# Patient Record
Sex: Male | Born: 1980 | Race: Black or African American | Hispanic: No | Marital: Single | Smoking: Current every day smoker
Health system: Southern US, Community
[De-identification: ages and names within clinical notes are randomized; demographics above are authoritative.]

## PROBLEM LIST (undated history)

## (undated) DIAGNOSIS — F419 Anxiety disorder, unspecified: Secondary | ICD-10-CM

## (undated) DIAGNOSIS — F209 Schizophrenia, unspecified: Secondary | ICD-10-CM

## (undated) DIAGNOSIS — J45909 Unspecified asthma, uncomplicated: Secondary | ICD-10-CM

---

## 2013-02-03 ENCOUNTER — Emergency Department: Payer: Self-pay | Admitting: Emergency Medicine

## 2013-02-03 LAB — COMPREHENSIVE METABOLIC PANEL
Albumin: 4.4 g/dL (ref 3.4–5.0)
Alkaline Phosphatase: 81 U/L (ref 50–136)
Bilirubin,Total: 0.7 mg/dL (ref 0.2–1.0)
Calcium, Total: 10 mg/dL (ref 8.5–10.1)
EGFR (African American): >60
EGFR (Non-African Amer.): >60
Glucose: 97 mg/dL (ref 65–99)
Osmolality: 276 (ref 275–301)
Potassium: 3.5 mmol/L (ref 3.5–5.1)
SGOT(AST): 27 U/L (ref 15–37)
SGPT (ALT): 31 U/L (ref 12–78)
Sodium: 139 mmol/L (ref 136–145)
Total Protein: 8.8 g/dL — ABNORMAL HIGH (ref 6.4–8.2)

## 2013-02-03 LAB — DRUG SCREEN, URINE
Amphetamines, Ur Screen: NEGATIVE (ref ?–1000)
Barbiturates, Ur Screen: NEGATIVE (ref ?–200)
MDMA (Ecstasy)Ur Screen: NEGATIVE (ref ?–500)
Methadone, Ur Screen: NEGATIVE (ref ?–300)
Opiate, Ur Screen: NEGATIVE (ref ?–300)
Tricyclic, Ur Screen: NEGATIVE (ref ?–1000)

## 2013-02-03 LAB — URINALYSIS, COMPLETE
Bacteria: NONE SEEN
Bilirubin,UR: NEGATIVE
Glucose,UR: NEGATIVE mg/dL (ref 0–75)
Ketone: NEGATIVE
Nitrite: NEGATIVE
Ph: 7 (ref 4.5–8.0)
Protein: NEGATIVE
RBC,UR: 2 /HPF (ref 0–5)
WBC UR: 2 /HPF (ref 0–5)

## 2013-02-03 LAB — CBC
MCH: 31.2 pg (ref 26.0–34.0)
RBC: 5.58 10*6/uL (ref 4.40–5.90)
RDW: 14 % (ref 11.5–14.5)

## 2013-02-04 LAB — CK TOTAL AND CKMB (NOT AT ARMC)
CK, Total: 1291 U/L — ABNORMAL HIGH (ref 35–232)
CK-MB: 1.8 ng/mL (ref 0.5–3.6)

## 2013-02-05 LAB — CK: CK, Total: 873 U/L — ABNORMAL HIGH (ref 35–232)

## 2013-02-06 LAB — CBC
HCT: 45.3 % (ref 40.0–52.0)
HGB: 15.8 g/dL (ref 13.0–18.0)
MCH: 31.5 pg (ref 26.0–34.0)
MCHC: 35 g/dL (ref 32.0–36.0)
Platelet: 183 10*3/uL (ref 150–440)
RBC: 5.02 10*6/uL (ref 4.40–5.90)
RDW: 13.9 % (ref 11.5–14.5)

## 2013-02-06 LAB — COMPREHENSIVE METABOLIC PANEL
Albumin: 3.6 g/dL (ref 3.4–5.0)
Anion Gap: 3 — ABNORMAL LOW (ref 7–16)
BUN: 10 mg/dL (ref 7–18)
Bilirubin,Total: 0.3 mg/dL (ref 0.2–1.0)
Chloride: 104 mmol/L (ref 98–107)
Co2: 30 mmol/L (ref 21–32)
EGFR (African American): >60
EGFR (Non-African Amer.): >60
Glucose: 91 mg/dL (ref 65–99)
Osmolality: 272 (ref 275–301)
Potassium: 3.9 mmol/L (ref 3.5–5.1)
SGOT(AST): 38 U/L — ABNORMAL HIGH (ref 15–37)
SGPT (ALT): 28 U/L (ref 12–78)
Sodium: 137 mmol/L (ref 136–145)
Total Protein: 7.4 g/dL (ref 6.4–8.2)

## 2013-06-23 ENCOUNTER — Inpatient Hospital Stay: Payer: Self-pay | Admitting: Psychiatry

## 2013-06-23 LAB — URINALYSIS, COMPLETE
Glucose,UR: 150 mg/dL (ref 0–75)
Ketone: NEGATIVE
Leukocyte Esterase: NEGATIVE
Ph: 6 (ref 4.5–8.0)
RBC,UR: 3 /HPF (ref 0–5)
Specific Gravity: 1.018 (ref 1.003–1.030)
Squamous Epithelial: NONE SEEN

## 2013-06-23 LAB — COMPREHENSIVE METABOLIC PANEL
Alkaline Phosphatase: 61 U/L (ref 50–136)
Anion Gap: 7 (ref 7–16)
BUN: 8 mg/dL (ref 7–18)
Bilirubin,Total: 0.4 mg/dL (ref 0.2–1.0)
Calcium, Total: 9.3 mg/dL (ref 8.5–10.1)
Chloride: 106 mmol/L (ref 98–107)
Co2: 26 mmol/L (ref 21–32)
EGFR (African American): >60
Glucose: 124 mg/dL — ABNORMAL HIGH (ref 65–99)
Potassium: 3.5 mmol/L (ref 3.5–5.1)
SGOT(AST): 24 U/L (ref 15–37)
SGPT (ALT): 22 U/L (ref 12–78)
Sodium: 139 mmol/L (ref 136–145)
Total Protein: 8 g/dL (ref 6.4–8.2)

## 2013-06-23 LAB — CBC
HGB: 16.2 g/dL (ref 13.0–18.0)
MCH: 31.3 pg (ref 26.0–34.0)
MCHC: 34.9 g/dL (ref 32.0–36.0)
Platelet: 243 10*3/uL (ref 150–440)
RBC: 5.17 10*6/uL (ref 4.40–5.90)
WBC: 7.1 10*3/uL (ref 3.8–10.6)

## 2013-06-23 LAB — DRUG SCREEN, URINE
Amphetamines, Ur Screen: NEGATIVE (ref ?–1000)
Barbiturates, Ur Screen: NEGATIVE (ref ?–200)
Cocaine Metabolite,Ur ~~LOC~~: NEGATIVE (ref ?–300)
MDMA (Ecstasy)Ur Screen: NEGATIVE (ref ?–500)
Tricyclic, Ur Screen: NEGATIVE (ref ?–1000)

## 2013-06-23 LAB — TSH: Thyroid Stimulating Horm: 1.51 u[IU]/mL

## 2013-06-23 LAB — ETHANOL
Ethanol %: <0.003 % (ref 0.000–0.080)
Ethanol: <3 mg/dL

## 2013-06-23 LAB — SALICYLATE LEVEL: Salicylates, Serum: 2.8 mg/dL

## 2013-06-23 LAB — ACETAMINOPHEN LEVEL: Acetaminophen: <2 ug/mL

## 2013-06-24 LAB — URINALYSIS, COMPLETE
Blood: NEGATIVE
Leukocyte Esterase: NEGATIVE
RBC,UR: 2 /HPF (ref 0–5)
Specific Gravity: 1.018 (ref 1.003–1.030)
Squamous Epithelial: NONE SEEN
WBC UR: 7 /HPF (ref 0–5)

## 2013-07-15 LAB — COMPREHENSIVE METABOLIC PANEL
Albumin: 4.3 g/dL (ref 3.4–5.0)
Anion Gap: 7 (ref 7–16)
BUN: 10 mg/dL (ref 7–18)
Calcium, Total: 9.5 mg/dL (ref 8.5–10.1)
Chloride: 103 mmol/L (ref 98–107)
Co2: 25 mmol/L (ref 21–32)
EGFR (Non-African Amer.): >60
Osmolality: 270 (ref 275–301)
Potassium: 3.9 mmol/L (ref 3.5–5.1)
SGPT (ALT): 82 U/L — ABNORMAL HIGH (ref 12–78)
Sodium: 135 mmol/L — ABNORMAL LOW (ref 136–145)
Total Protein: 8.9 g/dL — ABNORMAL HIGH (ref 6.4–8.2)

## 2013-07-15 LAB — ETHANOL: Ethanol %: <0.003 % (ref 0.000–0.080)

## 2013-07-15 LAB — CBC
HGB: 16.9 g/dL (ref 13.0–18.0)
MCH: 31.5 pg (ref 26.0–34.0)
MCV: 89 fL (ref 80–100)
Platelet: 210 10*3/uL (ref 150–440)
RBC: 5.35 10*6/uL (ref 4.40–5.90)
WBC: 8.7 10*3/uL (ref 3.8–10.6)

## 2013-07-16 ENCOUNTER — Inpatient Hospital Stay: Payer: Self-pay | Admitting: Psychiatry

## 2013-07-16 LAB — URINALYSIS, COMPLETE
Glucose,UR: NEGATIVE mg/dL (ref 0–75)
Nitrite: NEGATIVE
Ph: 5 (ref 4.5–8.0)
RBC,UR: <1 /HPF (ref 0–5)
Specific Gravity: 1.015 (ref 1.003–1.030)
Squamous Epithelial: NONE SEEN
WBC UR: 1 /HPF (ref 0–5)

## 2013-07-16 LAB — DRUG SCREEN, URINE
Amphetamines, Ur Screen: NEGATIVE (ref ?–1000)
Cannabinoid 50 Ng, Ur ~~LOC~~: NEGATIVE (ref ?–50)
Cocaine Metabolite,Ur ~~LOC~~: NEGATIVE (ref ?–300)
Methadone, Ur Screen: NEGATIVE (ref ?–300)
Opiate, Ur Screen: NEGATIVE (ref ?–300)
Tricyclic, Ur Screen: NEGATIVE (ref ?–1000)

## 2013-07-31 ENCOUNTER — Emergency Department: Payer: Self-pay | Admitting: Emergency Medicine

## 2013-07-31 LAB — CBC WITH DIFFERENTIAL/PLATELET
Basophil %: 0.3 %
Eosinophil #: 0.1 10*3/uL (ref 0.0–0.7)
Eosinophil %: 0.9 %
HCT: 37.9 % — ABNORMAL LOW (ref 40.0–52.0)
HGB: 13.1 g/dL (ref 13.0–18.0)
Lymphocyte #: 1.1 10*3/uL (ref 1.0–3.6)
MCH: 30.9 pg (ref 26.0–34.0)
MCHC: 34.6 g/dL (ref 32.0–36.0)
MCV: 89 fL (ref 80–100)
Monocyte %: 13.4 %
Neutrophil #: 9.5 10*3/uL — ABNORMAL HIGH (ref 1.4–6.5)
Neutrophil %: 76.4 %
RDW: 13.1 % (ref 11.5–14.5)
WBC: 12.5 10*3/uL — ABNORMAL HIGH (ref 3.8–10.6)

## 2013-07-31 LAB — COMPREHENSIVE METABOLIC PANEL
Alkaline Phosphatase: 75 U/L (ref 50–136)
Anion Gap: 3 — ABNORMAL LOW (ref 7–16)
BUN: 11 mg/dL (ref 7–18)
Co2: 31 mmol/L (ref 21–32)
Creatinine: 1.06 mg/dL (ref 0.60–1.30)
EGFR (African American): >60
EGFR (Non-African Amer.): >60
Potassium: 4 mmol/L (ref 3.5–5.1)
SGOT(AST): 53 U/L — ABNORMAL HIGH (ref 15–37)
SGPT (ALT): 177 U/L — ABNORMAL HIGH (ref 12–78)
Sodium: 134 mmol/L — ABNORMAL LOW (ref 136–145)
Total Protein: 7.2 g/dL (ref 6.4–8.2)

## 2013-09-16 LAB — URINALYSIS, COMPLETE
Bacteria: NONE SEEN
Blood: NEGATIVE
Glucose,UR: NEGATIVE mg/dL (ref 0–75)
Leukocyte Esterase: NEGATIVE
Ph: 6 (ref 4.5–8.0)
Protein: 100
Squamous Epithelial: NONE SEEN
WBC UR: 3 /HPF (ref 0–5)

## 2013-09-16 LAB — DRUG SCREEN, URINE
Amphetamines, Ur Screen: NEGATIVE (ref ?–1000)
Benzodiazepine, Ur Scrn: NEGATIVE (ref ?–200)
Cocaine Metabolite,Ur ~~LOC~~: NEGATIVE (ref ?–300)
MDMA (Ecstasy)Ur Screen: NEGATIVE (ref ?–500)
Opiate, Ur Screen: NEGATIVE (ref ?–300)
Tricyclic, Ur Screen: NEGATIVE (ref ?–1000)

## 2013-09-17 LAB — COMPREHENSIVE METABOLIC PANEL
Albumin: 4.6 g/dL (ref 3.4–5.0)
Alkaline Phosphatase: 72 U/L
Anion Gap: 7 (ref 7–16)
BUN: 9 mg/dL (ref 7–18)
Bilirubin,Total: 0.5 mg/dL (ref 0.2–1.0)
Calcium, Total: 9.8 mg/dL (ref 8.5–10.1)
Co2: 29 mmol/L (ref 21–32)
Creatinine: 1.17 mg/dL (ref 0.60–1.30)
Glucose: 102 mg/dL — ABNORMAL HIGH (ref 65–99)
Potassium: 4.9 mmol/L (ref 3.5–5.1)
SGOT(AST): 37 U/L (ref 15–37)
SGPT (ALT): 48 U/L (ref 12–78)
Total Protein: 8.8 g/dL — ABNORMAL HIGH (ref 6.4–8.2)

## 2013-09-17 LAB — TSH: Thyroid Stimulating Horm: 1.92 u[IU]/mL

## 2013-09-17 LAB — ETHANOL
Ethanol %: <0.003 % (ref 0.000–0.080)
Ethanol: <3 mg/dL

## 2013-09-19 ENCOUNTER — Inpatient Hospital Stay: Payer: Self-pay | Admitting: Psychiatry

## 2014-01-17 ENCOUNTER — Inpatient Hospital Stay: Payer: Self-pay | Admitting: Psychiatry

## 2014-01-17 LAB — DRUG SCREEN, URINE

## 2014-01-17 LAB — COMPREHENSIVE METABOLIC PANEL
ALBUMIN: 4.4 g/dL (ref 3.4–5.0)
Alkaline Phosphatase: 84 U/L
Anion Gap: 6 — ABNORMAL LOW (ref 7–16)
BUN: 7 mg/dL (ref 7–18)
Bilirubin,Total: 0.4 mg/dL (ref 0.2–1.0)
CALCIUM: 9.3 mg/dL (ref 8.5–10.1)
Chloride: 104 mmol/L (ref 98–107)
Co2: 26 mmol/L (ref 21–32)
Creatinine: 0.97 mg/dL (ref 0.60–1.30)
EGFR (African American): >60
EGFR (Non-African Amer.): >60
Glucose: 112 mg/dL — ABNORMAL HIGH (ref 65–99)
Osmolality: 271 (ref 275–301)
POTASSIUM: 3.8 mmol/L (ref 3.5–5.1)
SGOT(AST): 26 U/L (ref 15–37)
SGPT (ALT): 29 U/L (ref 12–78)
Sodium: 136 mmol/L (ref 136–145)
Total Protein: 8.9 g/dL — ABNORMAL HIGH (ref 6.4–8.2)

## 2014-01-17 LAB — URINALYSIS, COMPLETE
Bacteria: NONE SEEN
Bilirubin,UR: NEGATIVE
Glucose,UR: NEGATIVE mg/dL (ref 0–75)
Ketone: NEGATIVE
Leukocyte Esterase: NEGATIVE
Nitrite: NEGATIVE
Ph: 6 (ref 4.5–8.0)
Protein: NEGATIVE
RBC,UR: 2 /HPF (ref 0–5)
Specific Gravity: 1.017 (ref 1.003–1.030)
Squamous Epithelial: <1
WBC UR: 1 /HPF (ref 0–5)

## 2014-01-17 LAB — CBC
HCT: 49 % (ref 40.0–52.0)
HGB: 17.1 g/dL (ref 13.0–18.0)
MCH: 31.1 pg (ref 26.0–34.0)
MCHC: 34.8 g/dL (ref 32.0–36.0)
MCV: 89 fL (ref 80–100)
PLATELETS: 242 10*3/uL (ref 150–440)
RBC: 5.49 10*6/uL (ref 4.40–5.90)
RDW: 14.6 % — ABNORMAL HIGH (ref 11.5–14.5)
WBC: 7.9 10*3/uL (ref 3.8–10.6)

## 2014-01-17 LAB — SALICYLATE LEVEL: SALICYLATES, SERUM: 5 mg/dL — AB

## 2014-01-17 LAB — ACETAMINOPHEN LEVEL

## 2014-01-17 LAB — LITHIUM LEVEL

## 2014-01-17 LAB — ETHANOL: Ethanol %: <0.003 % (ref 0.000–0.080)

## 2014-01-17 LAB — TSH: THYROID STIMULATING HORM: 2.1 u[IU]/mL

## 2014-01-22 LAB — LITHIUM LEVEL: Lithium: 0.52 mmol/L — ABNORMAL LOW

## 2014-06-26 LAB — COMPREHENSIVE METABOLIC PANEL
ALBUMIN: 4.4 g/dL (ref 3.4–5.0)
ALT: 62 U/L
ANION GAP: 5 — AB (ref 7–16)
Alkaline Phosphatase: 78 U/L
BUN: 12 mg/dL (ref 7–18)
Bilirubin,Total: 0.5 mg/dL (ref 0.2–1.0)
CALCIUM: 9.5 mg/dL (ref 8.5–10.1)
Chloride: 106 mmol/L (ref 98–107)
Co2: 22 mmol/L (ref 21–32)
Creatinine: 1.35 mg/dL — ABNORMAL HIGH (ref 0.60–1.30)
Glucose: 101 mg/dL — ABNORMAL HIGH (ref 65–99)
Osmolality: 266 (ref 275–301)
POTASSIUM: 4.1 mmol/L (ref 3.5–5.1)
SGOT(AST): 28 U/L (ref 15–37)
Sodium: 133 mmol/L — ABNORMAL LOW (ref 136–145)
TOTAL PROTEIN: 9.1 g/dL — AB (ref 6.4–8.2)

## 2014-06-26 LAB — URINALYSIS, COMPLETE
BACTERIA: NONE SEEN
BILIRUBIN, UR: NEGATIVE
Cellular Cast: 2
Glucose,UR: NEGATIVE mg/dL (ref 0–75)
Hyaline Cast: 3
Leukocyte Esterase: NEGATIVE
Nitrite: NEGATIVE
Ph: 5 (ref 4.5–8.0)
Specific Gravity: 1.015 (ref 1.003–1.030)
Squamous Epithelial: <1
WBC UR: 2 /HPF (ref 0–5)

## 2014-06-26 LAB — DRUG SCREEN, URINE
AMPHETAMINES, UR SCREEN: NEGATIVE (ref ?–1000)
BARBITURATES, UR SCREEN: NEGATIVE (ref ?–200)
Benzodiazepine, Ur Scrn: NEGATIVE (ref ?–200)
Cannabinoid 50 Ng, Ur ~~LOC~~: NEGATIVE (ref ?–50)
Cocaine Metabolite,Ur ~~LOC~~: NEGATIVE (ref ?–300)
MDMA (ECSTASY) UR SCREEN: NEGATIVE (ref ?–500)
Methadone, Ur Screen: NEGATIVE (ref ?–300)
OPIATE, UR SCREEN: NEGATIVE (ref ?–300)
PHENCYCLIDINE (PCP) UR S: NEGATIVE (ref ?–25)
TRICYCLIC, UR SCREEN: NEGATIVE (ref ?–1000)

## 2014-06-26 LAB — CBC
HCT: 51.1 % (ref 40.0–52.0)
HGB: 16.8 g/dL (ref 13.0–18.0)
MCH: 29.8 pg (ref 26.0–34.0)
MCHC: 32.9 g/dL (ref 32.0–36.0)
MCV: 91 fL (ref 80–100)
PLATELETS: 253 10*3/uL (ref 150–440)
RBC: 5.63 10*6/uL (ref 4.40–5.90)
RDW: 13.4 % (ref 11.5–14.5)
WBC: 8.8 10*3/uL (ref 3.8–10.6)

## 2014-06-26 LAB — ACETAMINOPHEN LEVEL: Acetaminophen: <2 ug/mL

## 2014-06-26 LAB — ETHANOL: Ethanol: <3 mg/dL

## 2014-06-26 LAB — LITHIUM LEVEL: Lithium: 0.26 mmol/L — ABNORMAL LOW

## 2014-06-26 LAB — SALICYLATE LEVEL: Salicylates, Serum: 4.9 mg/dL — ABNORMAL HIGH

## 2014-06-27 ENCOUNTER — Inpatient Hospital Stay: Payer: Self-pay | Admitting: Psychiatry

## 2014-07-01 LAB — TSH: Thyroid Stimulating Horm: 1.92 u[IU]/mL

## 2014-07-01 LAB — HEMOGLOBIN A1C: Hemoglobin A1C: 5.5 % (ref 4.2–6.3)

## 2014-07-01 LAB — LITHIUM LEVEL: Lithium: 0.23 mmol/L — ABNORMAL LOW

## 2014-07-05 LAB — LITHIUM LEVEL: LITHIUM: 0.56 mmol/L — AB

## 2014-07-05 IMAGING — CR DG CHEST 2V
1 series · 2 of 2 positions shown · non-contrast
Comparison: 02/06/2013.

CLINICAL DATA: Right chest pain.

EXAM:
CHEST  2 VIEW

[Series 1: pa · 0.17mm/px · 2 of 2 slices shown]
[im 1/2]
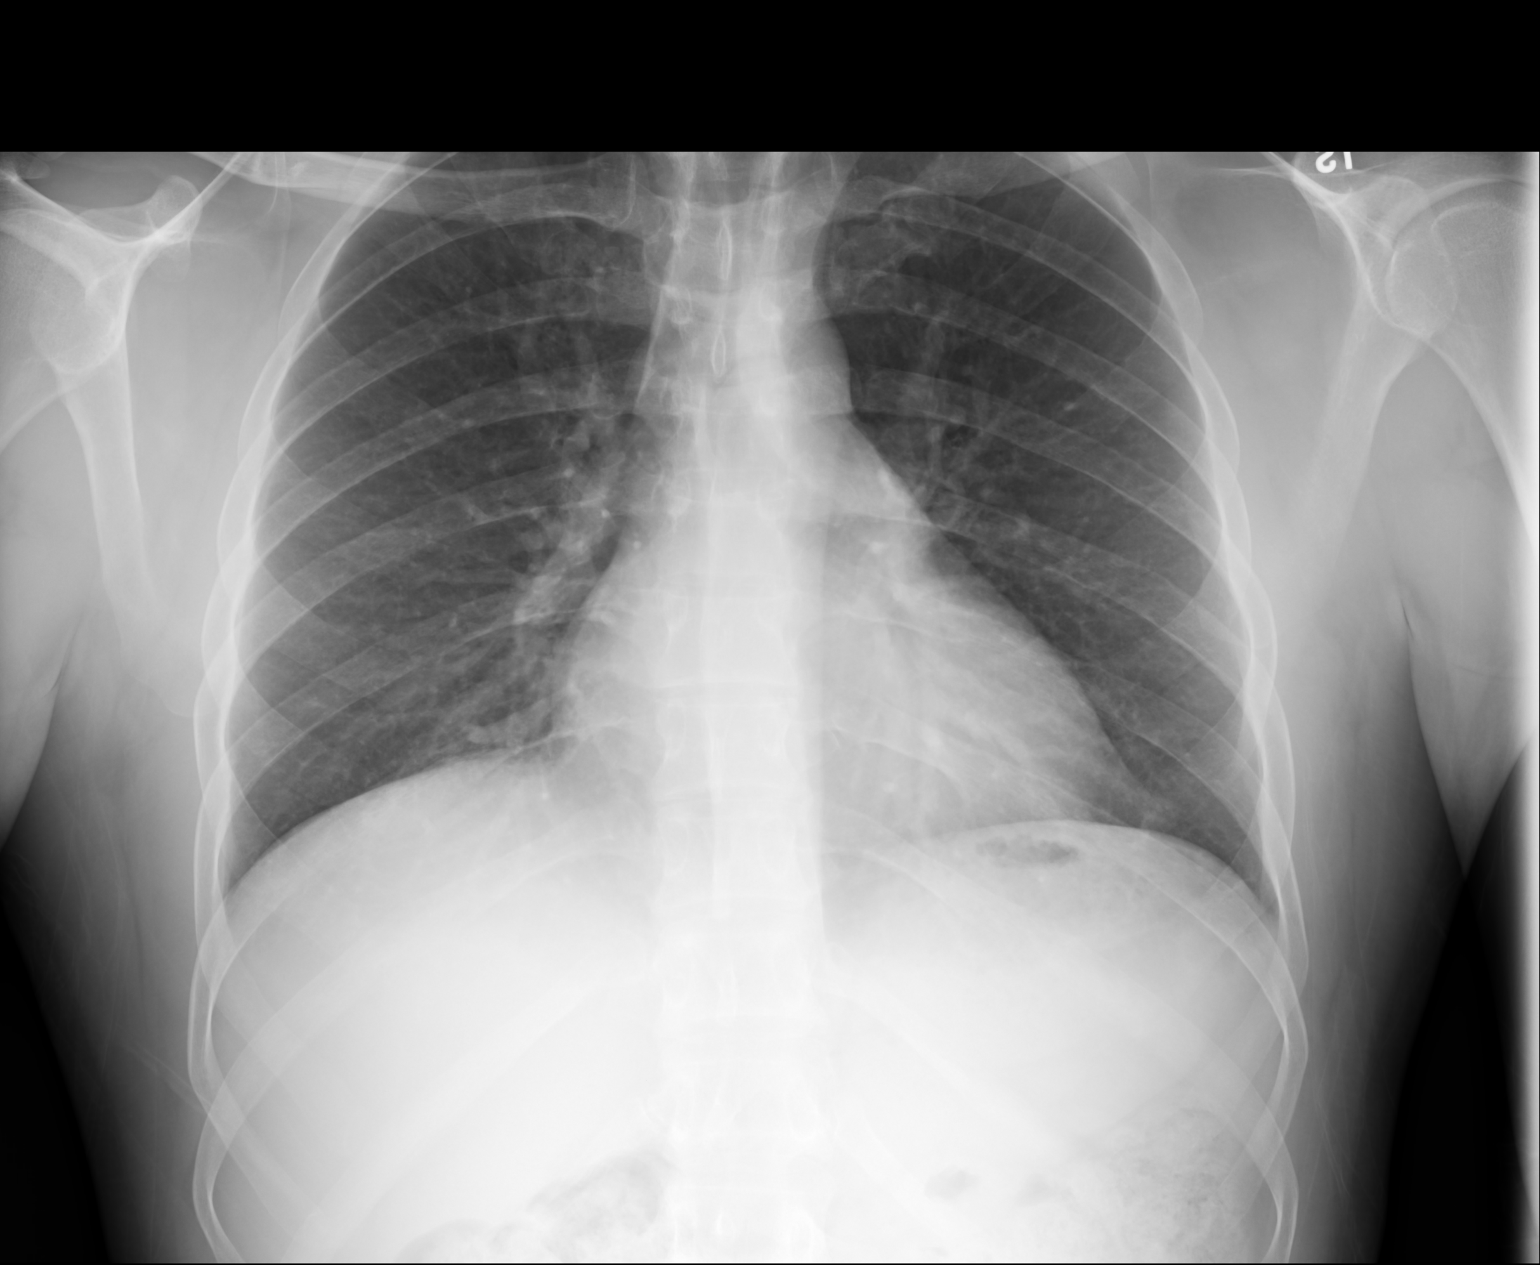
[im 2/2]
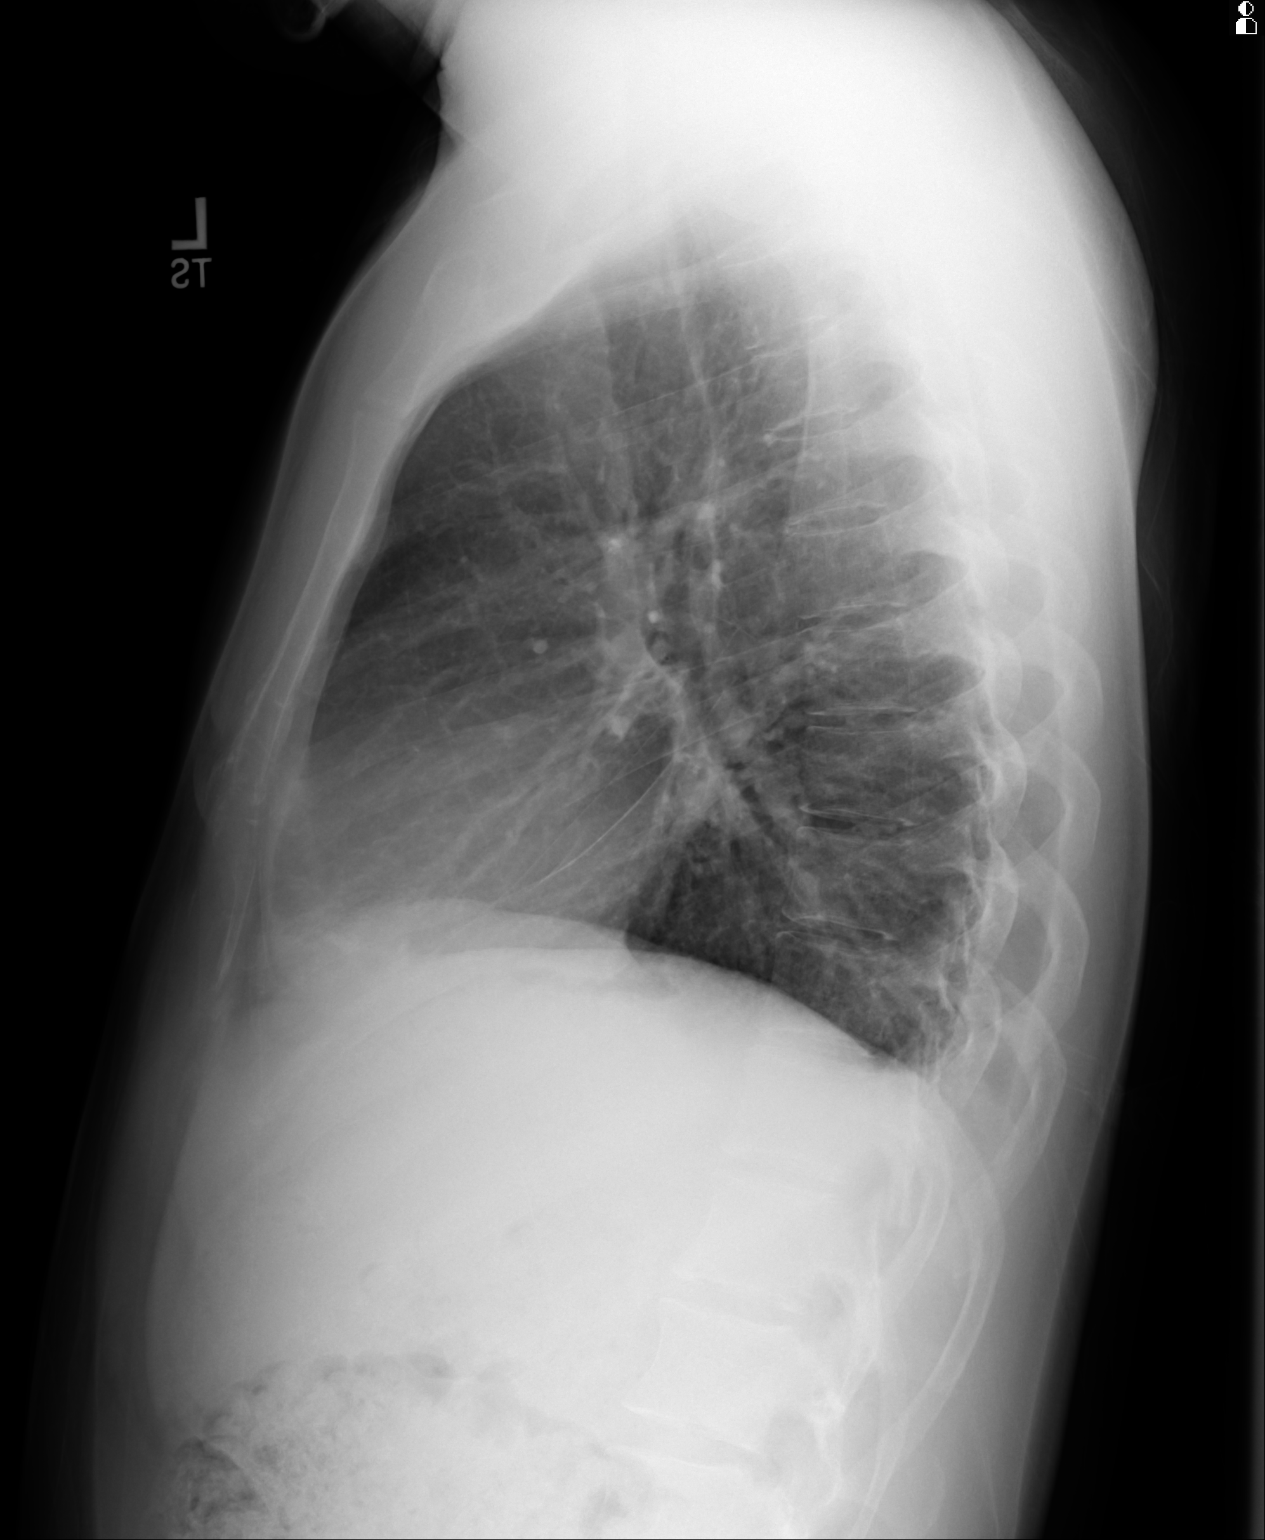

[2 of 2 positions shown; findings below may reference images not displayed]

FINDINGS: Poor inspiration. Grossly stable normal sized heart. Clear lungs.
Mild diffuse peribronchial thickening. Unremarkable bones.
IMPRESSION: Mild bronchitic changes.

## 2014-10-10 ENCOUNTER — Emergency Department: Payer: Self-pay | Admitting: Emergency Medicine

## 2014-10-10 LAB — COMPREHENSIVE METABOLIC PANEL
ALBUMIN: 4.1 g/dL (ref 3.4–5.0)
ALK PHOS: 76 U/L
Anion Gap: 4 — ABNORMAL LOW (ref 7–16)
BILIRUBIN TOTAL: 0.5 mg/dL (ref 0.2–1.0)
BUN: 12 mg/dL (ref 7–18)
CALCIUM: 9.4 mg/dL (ref 8.5–10.1)
CREATININE: 1.21 mg/dL (ref 0.60–1.30)
Chloride: 109 mmol/L — ABNORMAL HIGH (ref 98–107)
Co2: 30 mmol/L (ref 21–32)
EGFR (Non-African Amer.): >60
GLUCOSE: 99 mg/dL (ref 65–99)
Osmolality: 285 (ref 275–301)
Potassium: 3.9 mmol/L (ref 3.5–5.1)
SGOT(AST): 38 U/L — ABNORMAL HIGH (ref 15–37)
SGPT (ALT): 104 U/L — ABNORMAL HIGH
SODIUM: 143 mmol/L (ref 136–145)
Total Protein: 8.7 g/dL — ABNORMAL HIGH (ref 6.4–8.2)

## 2014-10-10 LAB — CBC WITH DIFFERENTIAL/PLATELET
BASOS ABS: 0.1 10*3/uL (ref 0.0–0.1)
BASOS PCT: 1.1 %
EOS PCT: 2 %
Eosinophil #: 0.1 10*3/uL (ref 0.0–0.7)
HCT: 49 % (ref 40.0–52.0)
HGB: 16.7 g/dL (ref 13.0–18.0)
LYMPHS ABS: 1.4 10*3/uL (ref 1.0–3.6)
Lymphocyte %: 21 %
MCH: 31 pg (ref 26.0–34.0)
MCHC: 34.2 g/dL (ref 32.0–36.0)
MCV: 91 fL (ref 80–100)
MONOS PCT: 13.2 %
Monocyte #: 0.9 x10 3/mm (ref 0.2–1.0)
NEUTROS ABS: 4.3 10*3/uL (ref 1.4–6.5)
Neutrophil %: 62.7 %
Platelet: 239 10*3/uL (ref 150–440)
RBC: 5.39 10*6/uL (ref 4.40–5.90)
RDW: 13.7 % (ref 11.5–14.5)
WBC: 6.8 10*3/uL (ref 3.8–10.6)

## 2014-10-10 LAB — DRUG SCREEN, URINE

## 2014-10-10 LAB — URINALYSIS, COMPLETE
BACTERIA: NONE SEEN
BILIRUBIN, UR: NEGATIVE
Glucose,UR: NEGATIVE mg/dL (ref 0–75)
Leukocyte Esterase: NEGATIVE
Nitrite: NEGATIVE
Ph: 5 (ref 4.5–8.0)
RBC,UR: 4 /HPF (ref 0–5)
SPECIFIC GRAVITY: 1.024 (ref 1.003–1.030)
WBC UR: 2 /HPF (ref 0–5)

## 2014-10-10 LAB — SALICYLATE LEVEL: SALICYLATES, SERUM: 2.9 mg/dL — AB

## 2014-10-10 LAB — ACETAMINOPHEN LEVEL

## 2014-10-10 LAB — LITHIUM LEVEL: Lithium: <0.2 mmol/L — ABNORMAL LOW

## 2014-10-10 LAB — ETHANOL

## 2014-11-12 ENCOUNTER — Inpatient Hospital Stay: Payer: Self-pay | Admitting: Psychiatry

## 2015-01-11 NOTE — H&P (Signed)
PATIENT NAME:  Clinton Hart, Clinton Hart MR#:  [id number] DATE OF BIRTH:  10-Jan-1981  DATE OF ADMISSION:  09/19/2013  DATE OF ADMISSION: 08/2013.   REFERRING PHYSICIAN: Emergency Room M.D.   ATTENDING PHYSICIAN: Lova Urbieta B. Jennet Maduro, MD  IDENTIFYING DATA: Clinton Hart is a 34 year old male with history of schizophrenia.   CHIEF COMPLAINT:  "I feel better now."   HISTORY OF PRESENT ILLNESS: Clinton Hart was hospitalized at [facility] twice recently. He returns to the hospital floridly psychotic in the context of medication noncompliance. He refused injectable long-lasting antipsychotic at the time of discharge a month ago he has not been taking medications at home. This is unexpected since the patient started receiving Medicaid since his discharge from [facility]. He was discharged on oral Risperdal for psychosis, trazodone for insomnia and hydroxyzine for anxiety. He spent several days in the Emergency Room as he came to the hospital catatonic, unable to communicate and initially refusing medications. He, with encouragement, took several doses of Risperdal and oral Invega and yesterday agreed to injection of 234 mg of [place] preparation. He came to the unit still psychotic, hallucinating but able to communicate and easily redirectable. Yesterday in the Emergency Room, he was unable to talk at all.  Today, he recognizes me from previous admission. He recognizes the unit. He already understands our routine, is asking for activities and wants to play basketball even though he clearly is unable to do so. He walks around pressing hands to his ears and face, does not maintain any eye contact and pretty much stumbles around the unit. There is no agitation, no symptoms of bipolar mania, no symptoms of depression. He denies alcohol or illicit substance use.   PAST PSYCHIATRIC HISTORY: He has been diagnosed with schizophrenia several years ago. He was hospitalized  twice recently at [facility]. He has a history of medication noncompliance. He denies ever attempting suicide.   FAMILY PSYCHIATRIC HISTORY: None reported.   PAST MEDICAL HISTORY: None.   ALLERGIES: SHELLFISH.   MEDICATIONS ON ADMISSION: None.   MEDICATIONS: At the time of discharge from [facility], Risperdal 3 mg twice daily, Cogentin 1 mg twice daily, trazodone 100 mg at bedtime, hydroxyzine 25 mg 4 daily. Medications at the time of transfer Gean Birchwood 234 mg, first injection given on December 29th; Invega 6 mg daily.   SOCIAL HISTORY: He seems to be homeless but he used to reside with his girlfriend and her infant child. 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  I am not certain if he was able to return to this job. He was supposed to follow up with [facility]. He refused community support team at the time of discharge. He now has Medicaid.   REVIEW OF SYSTEMS: CONSTITUTIONAL: No fevers or chills. No weight changes.  EYES: No double or blurred vision.  ENT: No hearing loss.  RESPIRATORY: No shortness of breath or cough.  CARDIOVASCULAR: No chest pain or orthopnea.  GASTROINTESTINAL: No abdominal pain, nausea, vomiting or diarrhea.  GENITOURINARY: No incontinence or frequency.  ENDOCRINE: No heat or cold intolerance.  LYMPHATIC: No anemia or easy bruising.  INTEGUMENTARY: No acne or rash.  MUSCULOSKELETAL: No muscle or joint pain.  NEUROLOGIC: No tingling or weakness.  PSYCHIATRIC: See history of present illness for details.   PHYSICAL EXAMINATION: VITAL SIGNS: Blood pressure 128/90, pulse 115, respirations 18, temperature 98.2.  GENERAL: This is a well-developed male, slightly unkept, in no acute distress.  HEENT: The pupils are equal, round and reactive to light. Sclerae are anicteric.  NECK: Supple. No thyromegaly.  LUNGS: Clear to auscultation. No dullness to percussion.  HEART: Regular rhythm and rate. No murmurs, rubs or gallops.   ABDOMEN: Soft, nontender, nondistended. Positive bowel sounds.  MUSCULOSKELETAL: Normal muscle strength in all extremities.  SKIN: No rashes or bruises.  LYMPHATIC: No cervical adenopathy.  NEUROLOGIC: Cranial nerves II through XII are intact. Normal gait.   LABORATORY DATA: Chemistries are within normal limits. Blood alcohol level is zero. LFTs within normal limits. TSH 1.92. Urine tox screen negative for substances. Urinalysis is not suggestive of urinary tract infection. Serum acetaminophen less than 2. Serum salicylates 3.2.   MENTAL STATUS EXAMINATION ON ADMISSION: The patient is alert and oriented to person and place only. He is pleasant, polite and tries to be cooperative even though he is troubled by psychotic symptoms. He maintains the no eye contact. His grooming is marginal. He wears hospital scrubs. His speech is soft and there is poverty of speech. His mood is okay with flat affect. Thought process is disorganized. He attends to internal stimuli but has much better reality check than today when he was incommunicado.  He denies thoughts of hurting himself or others. His cognition is poor due to psychotic disorganization. His insight and judgment are poor even though reportedly the patient came to the Emergency Room of his own will and drew attention of the security guard because he was staring intently at other patients.   SUICIDE RISK ASSESSMENT. This is a patient with history of psychosis but not suicidality or mood symptoms who came to the hospital floridly psychotic, disorganized in the context of medication noncompliance. He is at increased risk for suicide.   DIAGNOSES: AXIS I: Schizophrenia.  AXIS II: Deferred.  AXIS III: Deferred.  AXIS IV: Mental illness, treatment compliance, primary support, housing, employment, financial.  AXIS V: Global assessment of functioning 35.   PLAN: The patient was admitted [facility] Unit for safety,  stabilization and medication management. He was initially placed on suicide precautions and was closely monitored for any unsafe behaviors. He underwent full psychiatric and risk assessment. He received pharmacotherapy, individual and group psychotherapy, substance abuse counseling and support from therapeutic milieu.  1.  Psychosis. The patient will be continued on Invega 6 mg daily until he gets his second [place]vega Sustenna injection 4 or 5 days from now. The patient agreed to injection in the Emergency Room. In the past, he did not allow injections. It is unclear whether or not he will be compliant with treatment. That is why we keep oral Invega on board. We will continue Cogentin 1 mg twice daily. The patient did complain of some shoulder stiffness the last time he was here.  2.  Insomnia. We will restart trazodone.  3.  Anxiety. He responded well to hydroxyzine. We will have lorazepam available for symptoms of anxiety or if he becomes catatonic again.  4.  Asthma.  Albuterol inhaler is available.  5.  Disposition to be established. He could be placed. Usually the girlfriend takes him back. We will investigate.   ____________________________ Ellin GoodieJolanta B. Jennet MaduroPucilowska, MD jbp:cs D: 09/19/2013 14:48:38 ET T: 09/19/2013 15:39:24 ET JOB#: [id number]  cc: Mianna Iezzi B. Jennet MaduroPucilowska, MD, <Dictator> Shari ProwsJOLANTA B Loni Delbridge MD ELECTRONICALLY SIGNED 09/19/2013 21:12
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
## 2015-01-11 NOTE — Consult Note (Signed)
PATIENT NAME:  Clinton KinsmanWARREN, Hassani O MR#:  [id number] DATE OF BIRTH:  [birth year]  DATE OF CONSULTATION:  07/16/2013  CONSULTING PHYSICIAN:  Seneca Hoback K. Harvey Lingo, MD  AGE:  34 years  SEX:  Male  RACE:  African American   SUBJECTIVE: The patient was seen in consultation in the Emergency Room at [facility].  The patient is a 34 year old [place] male, single, never married, not employed and calls himself as "homeless" and has no place to go and stay.  According to the information obtained from the chart, the patient was discharged from inpatient hospitalization after being stabilized for schizophrenia and psychotic thinking.  The patient is noncompliant and reports that he is not on any medications.  He reports that he came to the hospital by himself and he could not give the reason.  He is a very poor historian.  Staff reports that he has been hallucinating in his room.   OBJECTIVE:  The patient is lying in bed.  He knew this was a hospital.  He refused to give the date.  He repeatedly stated, "I am fine, I don't know", most of the questions I asked him, "I don't know."  When he was asked if he was depressed, he yelled and stated he was not depressed.  When he was asked if he was hearing voices or seeing things, he said he is not and was demanding and very manipulative.  Cognition is below average.  Denies any active suicidal or homicidal plans, but behavior is unpredictable.  Insight and judgement guarded and impaired.   IMPRESSION:  Schizophrenia, chronic, paranoid exacerbation.   RECOMMENDATIONS:  We will start patient on Haldol 2 mg p.o. b.i.d. and Cogentin 1 mg p.o. b.i.d. and Ativan 1 mg p.o. every 4 hours p.r.n. for agitation.  The patient is to be re-evaluated in the morning or 07/17/2013. When he feels stable enough, he can be considered for admission to inpatient psychiatry.     ____________________________ Jannet MantisSurya K. Guss Bundehalla, MD skc:cc D: 07/16/2013 13:42:29 ET T: 07/16/2013 19:00:40  ET JOB#: [id number]  cc: Monika SalkSurya K. Guss Bundehalla, MD, <Dictator> Beau FannySURYA K Egan Berkheimer MD ELECTRONICALLY SIGNED 07/17/2013 8:42

## 2015-01-11 NOTE — H&P (Signed)
PATIENT NAME:  Clinton Hart, Clinton Hart MR#:  [id number] DATE OF BIRTH:  08-Jan-1981  DATE OF ADMISSION:  07/16/2013  REFERRING PHYSICIAN: Emergency Room M.D.   ATTENDING PHYSICIAN: Aashvi Rezabek B. Jennet MaduroPucilowska, M.D.   IDENTIFYING DATA: Clinton Hart is a 34 year old male with history of schizophrenia.   CHIEF COMPLAINT: "I need to find me a place."   HISTORY OF PRESENT ILLNESS: Clinton Hart was hospitalized at [facility] in October of this year.  He was discharged on October 14th, less than 2 weeks  prior to current admission. He returned to the hospital via police. He was spotted walking around [employer] parking lot for 3 hours, confused, disorganized and bizarre. It is unclear whether or not he has been compliant with his medications of trazodone, Cogentin and thiothixene. The patient is not able to provide much information. He was referred for outpatient treatment to [facility] but at this point I do not know whether or not he showed up for his followup appointments.  He denies symptoms of depression, denies suicidal thoughts, denies auditory hallucinations. He seems very disorganized, difficult to engage, talking to himself. He denies alcohol, illicit substance or prescription pill abuse.   PAST PSYCHIATRIC HISTORY: He has had mental illness for several years. He, however, does not have health insurance and has not been able to afford his medications or doctor visits. He is unable to provide good history.   FAMILY PSYCHIATRIC HISTORY: None reported.   PAST MEDICAL HISTORY: None.   ALLERGIES: SHELLFISH.   MEDICATIONS ON ADMISSION: Trazodone 100 mg daily, thiothixene 5 mg 3 times daily, Cogentin 0.5 mg 3 times daily. It is unclear whether the patient has been compliant.   SOCIAL HISTORY: He was out of state for a while. He apparently has some relatives living in the area. There is no disability or health insurance.   REVIEW OF SYSTEMS: CONSTITUTIONAL: No fevers or chills. No weight changes.   EYES: No double or blurred vision.  ENT: No hearing loss.  RESPIRATORY: No shortness of breath or cough.  CARDIOVASCULAR: No chest pain or orthopnea.  GASTROINTESTINAL: No abdominal pain, nausea, vomiting or diarrhea.  GENITOURINARY: No incontinence or frequency.  ENDOCRINE: No heat or cold intolerance.  LYMPHATIC: No anemia or easy bruising.  INTEGUMENTARY: No acne or rash.  MUSCULOSKELETAL: No muscle or joint pain.  NEUROLOGIC: No tingling or weakness.  PSYCHIATRIC: See history of present illness for details.   PHYSICAL EXAMINATION: VITAL SIGNS: Blood pressure 119/79, pulse 89, respirations 20, temperature 98.5.  GENERAL: This is a slender male in no acute distress.  HEENT: The pupils are equal, round and reactive to light. Sclerae are anicteric.  NECK: Supple. No thyromegaly.  LUNGS: Clear to auscultation. No dullness to percussion.  HEART: Regular rhythm and rate. No murmurs, rubs or gallops.  ABDOMEN: Soft, nontender, nondistended. Positive bowel sounds.  MUSCULOSKELETAL: Normal muscle strength in all extremities.  SKIN: No rashes or bruises.  LYMPHATIC: No cervical adenopathy.  NEUROLOGIC: Cranial nerves II through XII are intact.   LABORATORY DATA: Chemistries are within normal limits except for sodium of 135. Blood alcohol level is zero. LFTs within normal limits except for ALT of 82. Urine tox screen negative for substances. CBC within normal limits. Urinalysis is not suggestive of urinary tract infection. Serum acetaminophen and salicylates are low.   MENTAL STATUS EXAMINATION ON ADMISSION: The patient is alert and oriented to person, place and somewhat to situation. He is poorly groomed. There is poor eye contact. Speech is disorganized. There is  poverty of speech. He is a bad historian. Mood is fine with odd affect. Thought process is disorganized. He denies suicidal or homicidal ideation, delusions or paranoia. He does appear to attend to internal stimuli. His cognition is  difficult to assess. His insight and judgment are poor.   SUICIDE RISK ASSESSMENT ON ADMISSION: This is a patient with psychotic disorder who returns to the hospital shortly following discharge from Behavioral Medicine, floridly psychotic most likely in the context of treatment noncompliance.   DIAGNOSES: AXIS I: Schizophrenia.  AXIS II: Deferred.  AXIS III: Deferred.  AXIS IV: Mental illness, primary support, access to care, financial, employment, housing.  AXIS V: GAF 35.   PLAN: The patient was admitted to [facility] Medicine Unit for safety, stabilization and medication management. He was initially placed on suicide precautions and was closely monitored for any unsafe behaviors. He underwent full psychiatric and risk assessment. He received pharmacotherapy, individual and group psychotherapy, substance abuse counseling and support from therapeutic milieu.  1.  Psychosis. His history is very unclear. We will need to obtain collateral data.  I will put him on Invega tablets with a plan to give him [place] injection to improve compliance. Samples of Invega are available in the community.  2.  Social. The patient will meet with [employer] to establish if he has a chance to get disability and Medicaid.  3.  Disposition to be established.   ____________________________ Ellin Goodie. Jennet Maduro, MD jbp:cs D: 07/17/2013 20:21:18 ET T: 07/17/2013 20:45:08 ET JOB#: [id number]  cc: Dalexa Gentz B. Jennet Maduro, MD, <Dictator> Shari Prows MD ELECTRONICALLY SIGNED 07/20/2013 9:11

## 2015-01-11 NOTE — Consult Note (Signed)
Brief Consult Note: Diagnosis: Schizophrenia.   Patient was seen by consultant.   Consult note dictated.   Recommend further assessment or treatment.   Orders entered.   Comments: Mr. Clinton Hart has a h/o schizophrenia and treatment noncompliance. In the past he refused injectable antipsychotic. He arrived in the hospital floridly psychotic nearly catatonic. He initially refused medications but took a singly dose of risperdal yesterday.  PLAN: 1. The patient is on IVC.  2. Will restart oral invega and start Invega sustenna monthly injections begining today with 234 mg.  3. Will admit to psychiatry as soon as he agrees to treatment.  Electronic Signatures: Kristine LineaPucilowska, Darrah Dredge (MD)  (Signed 29-Dec-14 12:59)  Authored: Brief Consult Note   Last Updated: 29-Dec-14 12:59 by Kristine LineaPucilowska, Adisa Litt (MD)

## 2015-01-11 NOTE — Consult Note (Signed)
PATIENT NAME:  Clinton Hart, Clinton Hart MR#:  [id number] DATE OF BIRTH:  06-21-1981  DATE OF CONSULTATION:  09/18/2013  REFERRING PHYSICIAN:  Charlestine NightPhillip A. Scotty CourtStafford, MD CONSULTING PHYSICIAN:  Rhys Lichty B. Jossalyn Forgione, MD  REASON FOR CONSULTATION: To evaluate a psychotic patient.   IDENTIFYING DATA: Mr. Clinton JohnWarren is a 34 year old male with history of schizophrenia.   CHIEF COMPLAINT: The patient is unable to state.   HISTORY OF PRESENT ILLNESS: Mr. Clinton JohnWarren was hospitalized at [facility] twice this fall, in October and at the beginning of November. He responded well to Risperdal or Invega; however, at both times he refused to take injectable antipsychotic. He has not been compliant with treatment and returns again floridly psychotic. His usual presentation is with severe decompensation. The patient is almost catatonic, unable to talk, unable to take care of himself. He reportedly came to the Emergency Room and was behaving strangely, frightening his peers. He was brought to the behavioral unit bay. The patient initially refused medications, then with encouragement he took oral Risperdal. He however, still is very disorganized, unable to take care of himself. He is, however, redirectable. At some point, he showed some symptoms of agitation and was given a Geodon injection, but the patient does not have a history of violence. I cannot obtain any information from the patient, and my assessment is based on his 2 prior admissions. When started on medication, the patient usually gets better.   PAST PSYCHIATRIC HISTORY: As above. He was hospitalized at [facility] twice as before. He has a history of noncompliance with medication. There is no history of violence or suicide attempts. He has been diagnosed with mental illness several years ago and was hospitalized at other facilities prior to 2014, but has never been able to afford his medications. However during his last  hospitalizations, [facility] and now has Medicaid. Probably his disability application is pending.   FAMILY PSYCHIATRIC HISTORY: None reported.   PAST MEDICAL HISTORY: None.   ALLERGIES: SHELLFISH.   MEDICATIONS ON ADMISSION: None.   MEDICATIONS AT THE TIME OF DISCHARGE: Risperdal 3 mg twice daily, Cogentin 1 mg twice daily, trazodone 100 mg at bedtime, hydroxyzine 25 mg 4 times daily.   SOCIAL HISTORY: Up until recently he used to live with his girlfriend and her child. 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 He was hoping to return to work following his last hospitalization. As above, he has Medicaid now. I am not sure if he is connected with any local psychiatric providers, was supposed to follow up with [facility], but it is unclear whether or not he attended his appointments.   REVIEW OF SYSTEMS: Impossible to obtain, but the patient does not appear to be in any physical pain.   PHYSICAL EXAMINATION: VITAL SIGNS: Blood pressure 129/97, pulse 87, respirations 20, temperature 98.  GENERAL: This is a well-developed male in no obvious acute distress.   The rest of the physical examination is deferred to his primary attending.   LABORATORY DATA: Chemistries are within normal limits. Blood alcohol level is zero. LFTs within normal limits. TSH 1.92. Urine tox screen is negative for substances. CBC not done. Urinalysis is not suggestive of urinary tract infection. Serum acetaminophen less than 2. Serum salicylates 3.2.   MENTAL STATUS EXAMINATION: [facility] He is in bed. His ears are covered by his hands. He is completely still, does not respond to any of  my questions. Earlier on, he was seen in a general area. He walks like a lunatic, looks at the floor, bumps into objects and people. He could be redirected by gently pointing him towards his room. Earlier on, he was trying to move a chair that is too heavy to move but  insisted on doing so and was given an injection.   DIAGNOSES: AXIS I: Schizophrenia.  AXIS II: Deferred.  AXIS III: Deferred.  AXIS IV: Mental illness, treatment compliance, primary support.  AXIS V: Global Assessment of Functioning 20.  PLAN: We will restart the patient on Invega tablets 6 mg daily and try to give him [place] injection 234 mg today. Hopefully the patient will accept. When feeling better and accepting medication, he will be transferred to psychiatry. I will follow up.  ____________________________ Ellin Goodie. Jennet Maduro, MD jbp:jcm D: 09/18/2013 16:24:22 ET T: 09/18/2013 17:20:12 ET JOB#: [id number]  cc: [name] B. Jennet Maduro, MD, <Dictator> Shari Prows MD ELECTRONICALLY SIGNED 09/19/2013 4:45

## 2015-01-11 NOTE — Discharge Summary (Signed)
PATIENT NAME:  Clinton Hart, Clinton Hart DATE OF BIRTH:  [birth year]  DATE OF CONSULTATION: 07/04/2013  HOSPITAL COURSE: See dictated history and physical for details of admission. A 34 year old man was admitted to the hospital in an acutely psychotic state with bizarre stereotyped behavior and inability to communicate. He has been treated for presumptive schizophrenia with antipsychotic medication as well as individual, group, educational and supportive psychotherapy.   The patient has largely been compliant with oral medication and has gradually shown appropriate improvement in his mental state. For several days he remained very withdrawn and with minimal communication, but gradually he started to participate in more groups. He demonstrated an aptitude for playing the piano and drawing. His fiance came in to visit him in the hospital. All of these things seem to contribute to his opening up and becoming more normally interactive. He became more communicative. He was ultimately able to acknowledge that he had had hallucinations and confused, paranoid thinking which was resolving with the medicine.   The patient at no time made any violent gestures or made any statements of suicidality. He was agreeable to medication. He had no significant side effects. At the time of discharge, his affect is brighter and more appropriate, and he is showing good self-care.   He has been referred for outpatient treatment to [facility] for follow-up in the community. He has been educated about the use of his medicine.   LABORATORY RESULTS: Admission labs in the Emergency Room included a drug screen which was negative. TSH normal at 1.5. Alcohol level negative. Chemistry showing an elevated creatinine at 1.37, otherwise unremarkable. The CBC was normal. Urinalysis positive for glucose, 1+ blood. Acetaminophen and salicylates nontoxic. Follow-up chemistry showed a hemoglobin A1c of 5.3 suggesting that he does not  run chronic elevated blood sugars.   DISCHARGE MEDICATIONS: Trazodone 100 mg p.Hart. at bedtime, benztropine 0.5 mg b.i.d., thiothixene 5 mg three times a day.   MENTAL STATUS AT DISCHARGE: Neatly dressed and groomed man who looks his stated age, cooperative and pleasant with the interview. Eye contact good. Psychomotor activity appropriate. Speech is still decreased in amount and tone but much better than when he came in and he is able to hold an appropriate conversation. Affect still slightly blunted but not bizarre. He does have appropriate smiling and interaction. Mood is stated as good. Thoughts appear to be slow but lucid. No sign of acute delusions or paranoia. Denies auditory or visual hallucinations. Denies current suicidal or homicidal ideation. Shows improved judgment and insight. Normal intelligence. Alert and oriented x4.   DIAGNOSIS, PRINCIPAL AND PRIMARY:  AXIS I: Schizophrenia, undifferentiated.   SECONDARY DIAGNOSES:  AXIS I: No further.  AXIS II: No diagnosis.  AXIS III: No diagnosis.  AXIS IV: Moderate from being out of work and burden of illness.  AXIS V: Functioning at time of evaluation and discharge, 60.  ____________________________ Clinton AmelJohn T. Phoenyx Paulsen, MD jtc:np D: 07/04/2013 21:59:55 ET T: 07/04/2013 22:37:40 ET JOB#: [id number]  cc: Clinton AmelJohn T. Carsen Machi, MD, <Dictator> Clinton AmelJOHN T Christo Hain MD ELECTRONICALLY SIGNED 07/05/2013 17:37

## 2015-01-11 NOTE — Consult Note (Signed)
Brief Consult Note: Diagnosis: schizophrenia.   Patient was seen by consultant.   Consult note dictated.   Recommend further assessment or treatment.   Orders entered.   Discussed with Attending MD.   Comments: Psychiatry: Patient seen. Appears to be psychotic and nearly catatonnic. Likely scizophrenia. Nnot currently agitated. [facility] and [facility].  Electronic Signatures: Audery Amellapacs, John T (MD)  (Signed 03-Oct-14 17:13)  Authored: Brief Consult Note   Last Updated: 03-Oct-14 17:13 by Audery Amellapacs, John T (MD)

## 2015-01-12 NOTE — Discharge Summary (Signed)
PATIENT NAME:  Clinton KinsmanWARREN, Clinton Hart MR#:  [id number] DATE OF BIRTH:  1981/01/16  DATE OF ADMISSION:  01/17/2014 DATE OF DISCHARGE: 01/22/2014  HOSPITAL COURSE: See dictated history and physical for details of admission. This 34 year old gentleman with schizophrenia came into the hospital disorganized, slightly agitated, very psychotic with poor insight. He was given medication in the Emergency Room and started back on his mood stabilizer and antipsychotic. As soon as he started back taking it, his condition began to improve, but it took him a couple of days to return to a more reasonable baseline where he showed some insight and was able to hold a lucid conversation. He was given an injection of Invega Sustenna 156 mg intramuscular on 04/29. He was educated about the benefits of injectable medications and agrees to stay on his medicine outside the hospital. Arrangements were made and a prescription was done for him to get his next injection approximately May 27. He had no side effects from his medicine. He was indicating a plan to go back to work as early as possible. Just prior to discharge, his lithium level was checked and was 0.52, which seems appropriate given that he has not been manic, but the lithium has been an adjunct to his agitation when psychotic.   LABORATORY RESULTS: Just before discharge as mentioned lithium level was 0.52. On admission, drug screen negative. Lithium negative. TSH normal. Alcohol negative. Chemistry panel: No significant abnormalities. Hematology panel was all normal. Urinalysis was positive for some blood, but otherwise unremarkable.   DISCHARGE MEDICATIONS: Lithium carbonate 600 mg at night; benztropine 1 mg twice a day; Invega oral 6 mg at night; Hinda GlatterInvega Sustenna 156 mg intramuscular every 4 weeks, the next dose due approximately May 27; Temazepam 15 mg at night as needed for sleep and albuterol measured dose inhaler 2 puffs every 4 hours as needed for shortness of breath.    MENTAL STATUS EXAM AT DISCHARGE: Casually dressed, neatly groomed man, who looks his stated age, cooperative with the interview. Eye contact good. Psychomotor activity still a little bit slow. Affect still a little bit blunted. Mood stated as fine. Thoughts appear a little bit slow and concrete, but not bizarre or disorganized and he is able to hold a lucid conversation about his life. Denies suicidal or homicidal ideation. Denies having any hallucinations and does not appear to be responding to internal stimuli. Judgment and insight improved. Short-and long-term memory intact. Alert and oriented x 4. Normal fund of knowledge.   DISPOSITION: He is being discharged home and will follow up with [facility] with community support.   DIAGNOSIS, PRINCIPAL AND PRIMARY:  AXIS I: Schizophrenia, undifferentiated.   SECONDARY DIAGNOSES:  AXIS I: No further diagnosis.  AXIS II: No diagnosis.  AXIS III: Mild asthma.  AXIS IV: Severe from chronic illness.  AXIS V: Functioning at time of discharge 60.  ____________________________ Audery AmelJohn T. Verley Pariseau, MD jtc:aw D: 02/13/2014 10:00:00 ET T: 02/13/2014 10:32:49 ET JOB#: [id number]  cc: Audery AmelJohn T. Jackelyne Sayer, MD, <Dictator> Audery AmelJOHN T Viana Sleep MD ELECTRONICALLY SIGNED 02/22/2014 11:32

## 2015-01-12 NOTE — Consult Note (Signed)
Psychiatry: Follow-up for this patient was schizophrenia.  He states that he is not feeling any better.  Nerves are still bad.  Won't elaborate anymore details about it. everything on direct questioning on review of systems.  Not very convincing. status exam patient makes no eye contact.  Still paces quite a bit.  Affect flat and anxious.  At least eat is able to take his hand away from his face but he admits that he is not feeling any better than yesterday.  Poor self-care.  Cooperative with medicine.  Denies suicidal or homicidal ideation. was schizophrenia remains disorganized and psychotic.  Requires further hospital management for stabilization.  Patient will be admitted today to the psychiatry ward for follow-up treatment.  He is agreeable to the plan.  Electronic Signatures: Audery Amellapacs, Link Burgeson T (MD)  (Signed on 07-Oct-15 19:21)  Authored  Last Updated: 07-Oct-15 19:21 by Audery Amellapacs, Kaliya Shreiner T (MD)

## 2015-01-12 NOTE — Consult Note (Signed)
PATIENT NAME:  Clinton, Hart MR#:  [id number] DATE OF BIRTH:  1981/03/11  DATE OF CONSULTATION:  06/26/2014  REFERRING PHYSICIAN:   CONSULTING PHYSICIAN:  Audery Amel, MD  IDENTIFYING INFORMATION AND REASON FOR CONSULTATION: This is a 34 year old man with a history of schizophrenia or schizoaffective disorder who presents to the Emergency Room voluntarily.   CHIEF COMPLAINT: "I've been off my meds."   HISTORY OF PRESENT ILLNESS: Information obtained from the patient and the chart. The patient tells me that he has been off his meds for about 2 weeks. He does not have any specific reason why he stopped his medicine. He is a little bit evasive about describing his acute symptoms, but says that his nerves are bad. He does admit to me that he has been sleeping poorly at night. He says that his health has been fine. He admits that he has been drinking alcohol. He is not able to really give me a full accounting of how much, says that it is at least 3 beers a day. Denies that he has been abusing any drugs. He has not been following up with [facility] or any outpatient mental health treatment. The patient denies to me that he is having auditory hallucinations. Denies suicidal or homicidal ideation. Just says that his nerves are bad. Says that it has been getting gradually worse for the last couple of weeks.   PAST PSYCHIATRIC HISTORY: Clinton Hart has had several hospitalizations here, most recently in the spring of this year with the diagnosis most recently of schizophrenia. When he is off his medication and sick he can become catatonic, sometimes with an agitated bizarre catatonia. He has responded pretty well to medications, but has been repeatedly noncompliant. No known history of actual suicide attempts.   SOCIAL HISTORY: Says he has been living with his mother. Has not been able to work. He says he spends his day sitting around reading the Bible, which is probably true. Does not seem to have much social  life. He used to have a girlfriend, but he says she is not involved in his life anymore.   PAST MEDICAL HISTORY: Had asthma, fairly mild. Otherwise, no significant ongoing medical problems.   FAMILY HISTORY: Knows of no family history of mental illness.   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 Denies that he has ongoing drug problem. The alcohol problem has not seemed to be a major or primary issues.   CURRENT MEDICATIONS: He is not taking any.   ALLERGIES: No known drug allergies.   REVIEW OF SYSTEMS: He is a little bit evasive about this and tends to minimize everything. It seems to be almost painful for him to even have a conversation and he is having a hard time concentrating. He says that his nerves are bad. He denies auditory hallucinations, denies suicidal or homicidal ideation. Denies being in pain. Generally denies any other physical symptoms.   MENTAL STATUS EXAMINATION: Neatly groomed man, looks his stated age. He is curled up in a fetal position on a stretcher in the Emergency Room. He made no eye contact with me. His speech is telegraphic, halting, lots of thought blocking and quiet. Affect is flat. Mood is stated as being okay. Thoughts are also very blocked. Did not make any obviously bizarre statements. Denies that he is having hallucinations. Denies suicidal or homicidal ideation. He could recall 3 out of 3 objects at zero minutes with quite a bit of effort, could not  remember any of them at three minutes. He is alert and oriented to being in the hospital and the situation, but was not able to tell me the day or the month. Fund of knowledge is normal. Insight and judgment is adequate.   PHYSICAL EXAMINATION: The patient appears to be in no acute distress. Full physical not done. Able to move all extremities. Blood pressure currently 159/92, respirations 20, pulse 107, temperature 98.2.   LABORATORY RESULTS: Chemistry panel: Low sodium 133,  creatinine elevated at 1.35, glucose elevated at 101. Lithium level low at 0.26, which is low but not zero, which is good. His drug screen is all negative. CBC is all negative. Urinalysis: 1+ ketones, no sign of infection. Salicylates slightly up at 4.9, but nontoxic. Acetaminophen negative.   ASSESSMENT: A 34 year old man with schizophrenia who presents with a subjective complaint of nerves. 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 He always denies that he is having hallucinations when he is psychotic, but he has told me several times when he was back in his right mind that he does hallucinate in these situations. I think he is probably suffering pretty bad. By rights we should probably admit him to the hospital, but unfortunately we do not have a bed right now. The patient was informed of this. I am going to restart him on his medicines in accord with what he had last time, which are lithium 600 mg at night, Cogentin a milligram twice a day, Invega 6 mg at night, Restoril 15 mg at night and an albuterol inhaler. In addition, he is supposed to be on an [place]vega Sustenna shot. I am going to go ahead and give him the 234 mg starter shot here in the Emergency Room. Once we have a bed available hopefully we can admit him, unless he improves so much that he could be discharged earlier. We will also initiate referrals if possible.   DIAGNOSIS, PRINCIPAL AND PRIMARY:  AXIS I: Schizophrenia, undifferentiated.   SECONDARY DIAGNOSES: AXIS I: No further.  AXIS II: No diagnosis.  AXIS III: Mild asthma.  ____________________________ Audery AmelJohn T. Gene Glazebrook, MD jtc:sb D: 06/26/2014 16:45:51 ET T: 06/26/2014 17:10:41 ET JOB#: [id number]  cc: Audery AmelJohn T. Cecilia Nishikawa, MD, <Dictator> Audery AmelJOHN T Phineas Mcenroe MD ELECTRONICALLY SIGNED 07/02/2014 0:24

## 2015-01-12 NOTE — Discharge Summary (Signed)
PATIENT NAME:  NOEMI, ISHMAEL MR#:  [id number] DATE OF BIRTH:  Apr 28, 1981  DATE OF ADMISSION:  09/19/2013 DATE OF DISCHARGE:  09/28/2013  HOSPITAL COURSE: See dictated history and physical for details of admission. This 34 year old gentleman who has a history of psychotic disorder, was admitted to the hospital for the most recent of several admissions that he has had in the past year. At the time of presentation to the Emergency Room he was extremely psychotic with very disorganized thinking, bizarre behavior, inability to even communicate rational. He was kept in the (Dictation Anomaly)<<MERISSING TEXT>>  for several days and given medication management until he could be cooperative with treatment. He was then admitted to the behavioral health service, Since coming to the behavioral health service, he has been showing improvement in his symptoms. As recently as this last weekend he still had some disorganized thinking, but this last few days he has shown a great deal of improvement. He has been treated with Invega and mood stabilizer. The patient engaged in conversation about long-acting injectable medication and agreed eventually to accept treatment. He has now received both a 234 mg and a 156 mg Invega shot. His next shot would be scheduled for approximately February 2nd. In the meantime, he continues to take 6 mg of Invega at night. The patient has engaged in group and individual counseling. He has received specific counseling several times about the importance of staying on his medication and the risks of return of psychosis of he does not. The patient is stating some understanding and agreement to that. We struggled with the fact that he did not have a clear place day for several days. He has now been in contact with his girlfriend and says that he is certain that he will be able to stay with her again. He is planning to try to get work again. While I am supportive of his optimism, I am somewhat  skeptical about how well work is going to work out for him. The teams consensus has been that he would be well served by applying for disability and trying to get Medicaid so that he can get more complete and consistent medical services and we have informed him of this. I believe that the application process is underway. Medically, he has remained generally healthy. He has very rarely needed his inhaler during that time he has been here.   MENTAL STATUS EXAM AT DISCHARGE: Neatly dressed and groomed man who looks his stated age, cooperative with the interview. Good eye contact. Psychomotor activity more nearly normal. Affect is still a little bit blunted but not completely. Thoughts are still a little bit slow, but not blocked, they are much more lucid. Does not make any bizarre statements. Denies having any recent hallucinations. Denies suicidal or homicidal ideation. Does not appear to be responding to internal stimuli. He is alert and oriented x 4. Intelligence is normal. Judgment and insight improved.   DISCHARGE MEDICATIONS: Invega sustena 156 mg monthly, next dose scheduled for February 2nd approximately, Invega 6 mg at night, lithium carbonate 600 mg at night, Cogentin 1 mg every six hours as needed for EPS or stiffness, lorazepam 1 mg every six hours as needed for anxiety, temazepam 15 mg at night for sleep and an albuterol inhaler 2 puffs every four hours as needed for shortness of breath.   LABORATORY RESULTS: On presentation included drug screen that was negative. Chemistry panel: No significant abnormalities. Alcohol negative. TSH normal at 1.9. Urinalysis positive for ketones,  No sign of infection. Acetaminophen negative. Salicylates in the therapeutic range and low.   DISPOSITION: Discharged to stay with his girlfriend locally. He will be followed up by community support team from [facility].   DIAGNOSIS, PRINCIPAL AND PRIMARY:  AXIS I: Schizophrenia, undifferentiated.   SECONDARY DIAGNOSES: AXIS  I: No further.  AXIS II: No diagnosis.  AXIS III: Asthma, mild.  AXIS IV: Moderate to severe from being out of work and burden of illness and unstable living situation. AXIS V: Functioning at time of discharge: 55.   ____________________________ Audery AmelJohn T. Aliea Bobe, MD jtc:sg D: 09/28/2013 10:13:44 ET T: 09/28/2013 10:22:22 ET JOB#: [id number]  cc: Audery AmelJohn T. Harlowe Dowler, MD, <Dictator> Audery AmelJOHN T Alleen Kehm MD ELECTRONICALLY SIGNED 09/28/2013 23:00

## 2015-01-12 NOTE — H&P (Signed)
PATIENT NAME:  Clinton Hart, Clinton Hart MR#:  [id number] DATE OF BIRTH:  [birth year]  DATE OF ADMISSION:  01/17/2014  IDENTIFYING INFORMATION AND CHIEF COMPLAINT: A 34 year old man with a history of schizophrenia brought into the Emergency Room in a state of acute psychosis.   CHIEF COMPLAINT: "Just feeling, voices racing."   HISTORY OF PRESENT ILLNESS:  Information obtained from the patient and the chart. The patient is not able to give a lot of history right now. The patient tells me that he has not been on his medicine in a couple of months. He says he is not working right now. He denies that he is having any hallucinations. He denies having any mood symptoms and tells me that he is feeling fine. As usual, he minimizes or denies any symptoms but clearly looks like he is in some distress. The nurse doing the admission spoke with his mother who said that he ran out of his medicine a few days ago, has not "been right." He is walking around in circles acting strangely. Mother had thought he had been taking his medicine but it sounds like he probably has not been filling his prescriptions recently. The patient is not able to give other acute history.   PAST PSYCHIATRIC HISTORY: The patient has a diagnosis of schizophrenia. He has had at least a couple previous hospitalizations at our facility. He does well when he is on his medicine but he quickly decompensates off of medication. Far as we know, he has no history of violence and no history of suicide attempts. He had had psychotic symptoms it sounds like at least intermittently for several years previously and has had prior hospitalizations.   PAST MEDICAL HISTORY: Mild asthma, does not seem to be a major problem.   FAMILY HISTORY: No known family history of mental illness.   SOCIAL HISTORY: A little bit unclear right now. The patient tells me that he was living "nowhere." It sounds like he might have been staying at least, occasionally, with his mother from  the intake. He did not say anything when I ask him whether he had been staying with his girlfriend. We had gotten one report that he had had a job recently, but he tells me he is not working right now.   CURRENT MEDICATIONS: Probably not taking anything, but on last discharge, he was on [place]Invega Sustenna 156 mg every 4 weeks, Invega 6 mg at night, albuterol inhaler p.r.n., lithium 600 mg at night, Cogentin p.r.n.   ALLERGIES: SHELLFISH.   REVIEW OF SYSTEMS: The patient essentially denies everything right now. Says he is feeling fine. Denies hallucinations. Denies suicidal or homicidal ideation. Denies any physical symptoms. He does say that he is having racing thoughts.   MENTAL STATUS EXAMINATION:  Healthy-appearing man looks his stated age who was passively cooperative with the interview. He made no eye contact, keeping his hand over his face for pretty much the whole interview. He sits completely still and looks like he is suffering somehow, but his speech is decreased in total amount, but normal in tone with a sort of forced, cheerful affect. Mood is stated as fine. Thoughts appear to have thought blocking. He did not make any obviously bizarre statements but he has been showing some odd mannerisms here on the unit. Denies suicidal or homicidal ideation. Denies current hallucinations. Judgment and insight are reasonable, insofar as he knows he needs to be back on his medicine. Did not do specific memory testing, but he remembers things immediately  and longer term to gross interview. Normal fund of knowledge.   PHYSICAL EXAMINATION: GENERAL: Healthy-appearing gentleman in no acute physical distress. No skin lesions identified.  HEENT: Pupils equal and reactive. Face symmetric.  MUSCULOSKELETAL: Full range of motion at all extremities. Normal gait. NECK AND BACK:  Nontender to light palpation. CRANIAL NERVES: Symmetric and intact.  LUNGS: Clear with no wheezes.  HEART: Regular rate and rhythm.   ABDOMEN: Soft, nontender, normal bowel sounds.  VITAL SIGNS: Currently show a blood pressure of 189/80, respirations 22, pulse 104, temperature 100.   ASSESSMENT: A 34 year old man with a history of schizophrenia comes into the hospital acutely psychotic in a manner that is pretty typical of how I have seen him in the past. He is cooperative with the idea of getting back on his medication. He is not currently threatening or hostile. Needs hospital level treatment.   TREATMENT PLAN: Admit to psychiatry. Restart the Invega and the lithium as previously. Monitor vital signs. I suspect the temperature is probably going to come back down, but if it stays up we will reinvestigate it. Engage him in group and individual therapy. Get more social history.   LABORATORY RESULTS: Chemistry panel just shows a slightly elevated glucose of 112. Alcohol level negative. CBC unremarkable. Salicylates slightly high at 5. TSH normal at 2.1. Lithium level was undetectable. Drug screen all negative. Urinalysis unremarkable.   DIAGNOSIS, PRINCIPAL AND PRIMARY:  AXIS I:    Schizophrenia, paranoid type.   SECONDARY DIAGNOSES: AXIS I:    No further diagnosis.  AXIS II:   No diagnosis.  AXIS III:  Mild asthma.  AXIS IV:  Moderate to severe chronic stress from illness.  AXIS V:   Functioning at time of evaluation 30.   ____________________________ Audery Amel, MD jtc:ce D: 01/17/2014 15:13:21 ET T: 01/17/2014 15:31:54 ET JOB#: [id number]  cc: Audery Amel, MD, <Dictator> Audery Amel MD ELECTRONICALLY SIGNED 01/17/2014 17:55

## 2015-01-12 NOTE — H&P (Signed)
PATIENT NAME:  Clinton Hart, Clinton O MR#:  [id number] DATE OF BIRTH:  Oct 09, 1980  DATE OF ADMISSION:  06/27/2014  DATE OF ASSESSMENT:  06/28/2014   REFERRING PHYSICIAN:  Emergency Room MD   ATTENDING PHYSICIAN:  Kristine LineaJolanta Ishmael Berkovich, MD   IDENTIFYING DATA:  Mr. Clinton JohnWarren is a 34 year old male with history of schizophrenia.   CHIEF COMPLAINT:  "I feel anxious."   HISTORY OF PRESENT ILLNESS:  Mr. Clinton JohnWarren has been hospitalized at [facility] several times already. He returns to the hospital floridly psychotic in the context of treatment noncompliance. Following his discharge from [facility] in October 2014, November 2014, January 2015, and May 2015, he never followed up with his outpatient providers even though at times he was placed on involuntary outpatient psychiatric commitment, he was offered injectable antipsychotics that were helpful, and at times work with [facility] community support team. Following last discharge in May 2015, he stayed in contact with his community support team for a month. Apparently, he has been off his [place]Invega Sustenna since June. When on medication, Mr. Clinton JohnWarren is a very pleasant, hard-working, serious person. He is able to get a job and maintain a job when well. He used to be in a relationship with a girlfriend who had been very supportive of him, but lately his illness took the better of him. He arrived in the hospital in acute distress, very anxious, restless, hallucinating, paranoid, religiously preoccupied. On the unit, he was yelling and screaming from the top of his lungs, frightened, and hyperreligious. He had to be given medication in the Emergency Room and in the hospital already. He is unable to provide any information about his current circumstances. He tells me that he feels very anxious and has been pacing in his room, intermittently screaming loudly. He is not able to provide much information. He tells me that he no longer lives with a girlfriend but lives  independently. At times, he stays with his mother. He was unable to give me his address, and with much prompting, he probably arrived at the right address for his mother on [address] in [place]. There are no substances involved.   PAST PSYCHIATRIC HISTORY:  As above, there were several psychiatric hospitalizations. Oftentimes when the patient comes to the hospital, he refuses any medications and it takes him a while to get better. Once on TanzaniaInvega Sustenna injections, he is able to maintain employment and have a fairly normal life. During last hospitalization, he was started on lithium. His lithium level on admission was subtherapeutic at 0.26, but negative, so it is possible that he has been taking some medications. He was given Gean BirchwoodInvega Sustenna initial injection in the Emergency Room already. The patient does not use substances.   FAMILY PSYCHIATRIC HISTORY:  None reported.   PAST MEDICAL HISTORY:  None.   ALLERGIES:  SHELLFISH.   MEDICATIONS ON ADMISSION:  None.   SOCIAL HISTORY:  He graduated from high school. He was employed gainfully by several employers while he refuses to apply for disability, believing that he can find employment and have a normal life. Apparently, he lives with his mother now. He has no health insurance and this is an impediment in his treatment.   REVIEW OF SYSTEMS: CONSTITUTIONAL:  No fevers or chills. Positive for weight gain. The patient weighs 185 pounds now, much more than during his previous hospitalizations.  EYES:  No double or blurred vision.  ENT:  No hearing loss.  RESPIRATORY:  No shortness of breath or cough.  CARDIOVASCULAR:  No chest pain or orthopnea.  GASTROINTESTINAL:  No abdominal pain, nausea, vomiting, or diarrhea.  GENITOURINARY:  No incontinence or frequency.  ENDOCRINE:  No heat or cold intolerance.  LYMPHATIC:  No anemia or easy bruising.  INTEGUMENTARY:  No acne or rash.  MUSCULOSKELETAL:  No muscle or joint pain.  NEUROLOGIC:  No  tingling or weakness.  PSYCHIATRIC:  See history of present illness for details.   PHYSICAL EXAMINATION: VITAL SIGNS:  Blood pressure 150/97, pulse 105, respirations 18, temperature 98.9.  GENERAL:  This is a well-developed young male clearly psychotic.  HEENT:  The pupils are equal, round, and reactive to light. Sclerae are anicteric.  NECK:  Supple. No thyromegaly.  LUNGS:  Clear to auscultation. No dullness to percussion.  HEART:  Regular rhythm and rate. No murmurs, rubs, or gallops.  ABDOMEN:  Soft, nontender, nondistended. Positive bowel sounds.  MUSCULOSKELETAL:  Normal muscle strength in all extremities.  SKIN:  No rashes or bruises.  LYMPHATIC:  No cervical adenopathy.  NEUROLOGIC:  Cranial nerves II through XII are intact.   LABORATORY DATA:  Chemistries are within normal limits except for creatinine of 135 and sodium 133. Blood alcohol level is 0. LFTs are within normal limits. Total protein is 9.1. Lithium is 0.26. Urine toxicology screen is negative for substances. CBC is within normal limits. Urinalysis is not suggestive of urinary tract infection. Serum acetaminophen is less than 2. Serum salicylate is 4.9.   MENTAL STATUS EXAMINATION ON ADMISSION:  The patient is alert and oriented to person and place and somewhat to situation. He is pleasant, polite, and tries to be cooperative. He recognizes me from previous admission. He maintains limited eye contact. He moves continuously in his room, complaining of feeling anxious. He is poorly groomed with strong body odor. His speech is limited. He answers simple questions with monosyllables, yes and no answers. His mood is anxious with labile affect. Thought process is illogical and influenced by paranoid delusions and auditory hallucinations. He denies thoughts of hurting himself or others. He is paranoid, delusional, and hallucinating. He is preoccupied with religious themes. His cognition is impaired. His thinking is disorganized. He is a  poor historian, unable to answer even simple questions. He is of normal intelligence and fund of knowledge when well. His insight and judgment are extremely poor.   SUICIDE RISK ASSESSMENT ON ADMISSION:  This is a patient with history of psychosis who came to the hospital floridly psychotic and disorganized in the context of treatment noncompliance.   INITIAL DIAGNOSES:  AXIS I:  Schizoaffective disorder, bipolar type.   AXIS II:  Deferred.   AXIS III:  Deferred.   PLAN:  The patient was admitted to [facility] Medicine Unit for safety, stabilization, and medication management. He was initially placed on suicide precautions and was closely monitored for any unsafe behaviors. He underwent full psychiatric and risk assessment. He received pharmacotherapy, individual and group psychotherapy, substance abuse counseling, and support from therapeutic milieu.   1.  Agitated behavior. The patient was given p.r.n. medication in the Emergency Room. He has Thorazine and Ativan available for severe agitation and anxiety.  2.  Mood and psychosis. He was started on lithium in the Emergency Room, and the first dose of [place] was given there as well, next dose in 4 days. He is also on Invega oral tablets 6 mg twice daily.   DISPOSITION:  He will be discharged back to his mother's. We will try to  get community support to work with him again and follow up with [facility]. It is very unfortunate that the patient refuses to cooperate with treatment and especially that he refuses to apply for disability, which he surely deserves. He will have the opportunity to fill out [place] applications for free medications. We will also ask the court to grant [place] 180 days of outpatient involuntary psychiatric commitment to [facility], as the patient has to be monitored closely and probably brought to the hospital every time he stops treatment.    ____________________________ Braulio Conte B. Jennet Maduro,  MD jbp:nb D: 06/28/2014 23:03:28 ET T: 06/29/2014 00:17:24 ET JOB#: [id number]  cc: Shakisha Abend B. Jennet Maduro, MD, <Dictator> Shari Prows MD ELECTRONICALLY SIGNED 07/30/2014 6:13

## 2015-01-20 NOTE — Consult Note (Signed)
PATIENT NAME:  Clinton Hart, Clinton Hart MR#:  [id number] DATE OF BIRTH:  1980-11-11  DATE OF CONSULTATION:  10/10/2014  REFERRING PHYSICIAN:   CONSULTING PHYSICIAN:  Clinton Amel, MD  IDENTIFYING INFORMATION AND REASON FOR CONSULTATION: A 34 year old man with a history of schizophrenia who presents to the Emergency Room.   CHIEF COMPLAINT: "I'm off my medicine."   HISTORY OF PRESENT ILLNESS: Information obtained from the patient and the chart. The patient tells me that he has been off his medicines for about 2 weeks. At the same time, he tells me that he has not seen a doctor or been on any medicines since he left here last, which was in October, so I am not sure exactly what the time course is. He says he sleeps fine, and has been eating fine. His mood has been feeling nervous. As is typically the case when he is psychotic, he is hesitant to explicitly discuss his symptoms, but acknowledges that he is having auditory hallucinations. Denies suicidal or homicidal ideation completely. He says that he has been drinking a little alcohol, only a couple of beers every now and then, and denies that he is using any other drugs. He says that he has been staying at home with his mother recently. Cannot describe his daily activity very well.   PAST PSYCHIATRIC HISTORY: Multiple admissions to the hospital, in similar condition and at times worse. When he is very psychotic, he displays a lot of thought blocking and stereotyped behavior, and becomes nearly catatonic. He is not quite at this state yet. He does not have a history of suicide attempts or violence to others. He has responded well to medication, but has a history of noncompliance.   SOCIAL HISTORY: The patient does have family here in the area, and it is possible that he may have been staying with his mother, although he also frequently goes to new places or stays with other people when he is sick. He is supposed to be following up with [facility] and [employer]. He is not working.   PAST MEDICAL HISTORY: The patient has no significant ongoing medical problems.   FAMILY HISTORY: He is not aware of any family history of mental illness.   SUBSTANCE ABUSE HISTORY: Typically does not abuse drugs or alcohol as a major or significant part of his illness.   CURRENT MEDICATIONS: Says he is off of all of his medicine. When he was discharged from here last time, he was supposed to be taking [place] shots, Invega orally, Flexeril at night, and lithium at night.   ALLERGIES: SHELLFISH. No medicine allergies.    REVIEW OF SYSTEMS: The patient says that he is off his medicine. (Dictation Anomaly) <<heMISSING TEXT>> admits hallucinations. Denies suicidal or homicidal ideation. Does not report any specific physical complaints.   MENTAL STATUS EXAMINATION: Reasonably well-groomed young man; looks his stated age; cooperative to the extent he can be. Still has only partial eye contact, and has slow psychomotor activity, slow speech and decreased amount, thoughts marked thought blocking and long pauses, and a lot of silence in the conversation. Mood stated as being alright. Denies suicidal or homicidal ideation. Admits auditory hallucinations. He is alert and oriented x 4. Can repeat 3 words immediately; remembers 2 of them at 3 minutes. Judgment and insight reasonably intact, in that he knows he needs to be back on his medicine.   LABORATORY RESULTS: Drug Screen; all negative. Alcohol: Negative. Chemistry Panel: Nothing significant. Lithium: Undetectable. Hematology Panel: All  normal. Urinalysis: 1+ blood; no sign of infection.   VITAL SIGNS: Blood pressure currently 123/82, respirations 18, pulse 128, temperature 98.2.   ASSESSMENT: A 34 year old man with schizophrenia. Typical psychotic symptoms lots of negative symptoms, some hallucinations and paranoia, but no violence or threatening behavior. Has insight into knowing that he needs to be back on his  medicine. Patient is not currently under involuntary commitment. Certainly, needs to be back on his medication treatment.   TREATMENT PLAN: I discussed with the patient the options of admission to the hospital versus starting him on his medicine and sending him home. He would rather do the latter. I also reminded him that his history of noncompliance makes that a less certain way to get him well. He understands this, but says he will stay on his medicine. I am going to give him his Hinda Glatternvega shot here in the Emergency Room. I discussed the case with the Emergency Room doctor. We will print out prescriptions and put them on his chart. If we can get a hold of his mother and she is okay with it so we know he has a place to live, we may consider discharge rather than admission.   DIAGNOSIS, PRINCIPAL AND PRIMARY:  AXIS I: Schizophrenia.    SECONDARY DIAGNOSES:   AXIS I: No further. AXIS II: No diagnosis.  AXIS III: No diagnosis.    ____________________________ Clinton AmelJohn T. Marck Mcclenny, MD jtc:MT D: 10/10/2014 14:58:42 ET T: 10/10/2014 15:18:45 ET JOB#: [id number]  cc: Clinton AmelJohn T. Kelyn Ponciano, MD, <Dictator> Clinton AmelJOHN T Clinton Avena MD ELECTRONICALLY SIGNED 10/31/2014 17:19

## 2015-01-20 NOTE — Consult Note (Signed)
PATIENT NAME:  Clinton Hart, Clinton Hart MR#:  [id number] DATE OF BIRTH:  1981/02/22  DATE OF CONSULTATION:  11/12/2014  REFERRING PHYSICIAN:   CONSULTING PHYSICIAN:  Audery AmelJohn T. Clapacs, MD  IDENTIFYING INFORMATION AND REASON FOR CONSULTATION: A 34 year old man with a history of schizophrenia who came to the Emergency Room with a chief complaint "I need my meds."  HISTORY OF PRESENT ILLNESS: Information obtained from the patient and the chart. The patient states that he has been off his medicine for a while. He is a little vague about how long it has been, sounds like it has been at least a month. Has not been taking his lithium. He cannot remember when he last got his shot. Tells me he thinks he is probably due for it now. He is hesitant to endorse any kind of symptoms in conversation with me. He does tell me that he is "feeling overwhelmed". He will not be more specific about what that means. Says that he sleeps okay and that his appetite is okay. His usual lifestyle is "just chilling out". He denies that he has been drinking or using any drugs. He says that he does go to [facility], but he cannot tell me when he last followed up with them. The patient denies having any hallucinations or paranoia, although I find his denial someone unconvincing.   PAST PSYCHIATRIC HISTORY: History of schizophrenia and has had several admissions just over the last couple of years. Has a history of noncompliance with medication outside the hospital, which has caused decompensation. Part of this is because of his own denial of his illness and his refusal to get insurance coverage. He has responded well to a combination of Invega and lithium in the past. No known history of suicidal or violent behavior.   PAST MEDICAL HISTORY: The patient has mild asthma. Otherwise, no significant ongoing medical problems.   SOCIAL HISTORY: He gives his mother's address as the place where he lives. Says that he mostly stays around the house. He will not  give me any more detail about his current social situation.   FAMILY HISTORY: None known.   CURRENT MEDICATIONS: I doubt that he is taking any.   ALLERGIES: SHELLFISH.   SUBSTANCE ABUSE HISTORY: The patient says he does not drink and is not using any drugs. He has not had a significant substance use history in the past.   REVIEW OF SYSTEMS: Says that he feels overwhelmed and will not give me any more detail about what that means. Says that he sleeps okay. Every other specific symptom I ask about he denies. Denies hallucinations. Denies suicidal or homicidal ideation.   MENTAL STATUS EXAMINATION: A somewhat disheveled man, looks like he has not been bathing or taking care of himself well recently. Although he interacts with me, he makes almost no eye contact at all. He is curled up in a fetal position on a stretcher. Psychomotor activity very limited. Speech is very quiet and minimal in amount. Answers most questions with only a couple of words. Affect is flat and looks anxious and frightened to me intermittently. Mood is stated as being overwhelmed. Thoughts are marked by thought blocking, paucity of thought. Denies hallucinations. Denies suicidal or homicidal ideation. Can repeat 3 words immediately, remembers 3 of them at three minutes. His judgment and insight is adequate at least in coming to the hospital. He is alert and oriented x4.   LABORATORY RESULTS: We do not have a full panel of labs back yet. His basic  chemistry just shows a slightly elevated glucose at 117. Lithium undetectable. CBC all normal.   VITAL SIGNS: Currently, blood pressure is 147/84, respirations 16, pulse 111, temperature 98.1.   ASSESSMENT: A 34 year old man with a history of schizophrenia or schizoaffective disorder. Off his medicine he tends to first become flat and withdrawn the way he is now and then at other times has gotten very agitated. To me he looks very much like he is falling into a psychotic condition. Based  on his past history of response and of noncompliance, I think it is much safer to admit him to the hospital for what is clearly a psychotic situation.   TREATMENT PLAN: The patient agrees to voluntary admission to the hospital. I will give him his Invega shot of 234 mg and continue his 6 mg twice a day Invega and his lithium. Psychoeducation about rationale for admission done. Review labs. Primary team can work on further treatment. Close and elopement precautions in place.   DIAGNOSIS, PRINCIPAL AND PRIMARY:  AXIS I: Schizophrenia.   SECONDARY DIAGNOSES:  AXIS I: No further.  AXIS II: Deferred.  AXIS III: Mild asthma.   ____________________________ Audery Amel, MD jtc:sb D: 11/12/2014 12:59:38 ET T: 11/12/2014 13:06:21 ET JOB#: [id number]  cc: Audery Amel, MD, <Dictator> Audery Amel MD ELECTRONICALLY SIGNED 11/27/2014 10:36

## 2015-01-20 NOTE — Consult Note (Signed)
Brief Consult Note: Diagnosis: schizophrenia.   Patient was seen by consultant.   Consult note dictated.   Orders entered.   Discussed with Attending MD.   Comments: Psychiatry: Patient seen and chart reviewed. Patient off his medication and having typical psychotic symptoms. Denies any SI or HI. Will restart Gean BirchwoodInvega Sustenna and other psych meds. If family can be reached will consider discharge as he is requesting.  Electronic Signatures: Audery Amellapacs, Laney Louderback T (MD)  (Signed 20-Jan-16 14:52)  Authored: Brief Consult Note   Last Updated: 20-Jan-16 14:52 by Audery Amellapacs, Keyler Hoge T (MD)

## 2015-01-29 NOTE — H&P (Signed)
Please see the dictation labeled "consultation". This contains all of the information for the history and physical.  Electronic Signatures: Antonyo Hinderer, Jackquline DenmarkJohn T (MD)  (Signed on 05-Oct-14 20:21)  Authored  Last Updated: 05-Oct-14 20:21 by Audery Amellapacs, Mieczyslaw Stamas T (MD)

## 2015-01-29 NOTE — H&P (Signed)
PATIENT NAME:  Clinton Hart, Clinton Hart [id number] OF BIRTH:  05/26/1981 OF ADMISSION:  11/12/2014 INFORMATION: A 34 year old man with a history of schizophrenia who came to the Emergency Room by EMS.  complaint "I need my meds." OF PRESENT ILLNESS: "Pt in via EMS from home with c/o being out of medication for schizophrenia for 1 week. EMS reports pts mom made him come. Pt looking down at the floor and pacing in waiting room". patient states that he has been off his medicine for a while. He is a little vague about how long it has been "it has been a while".  He decided to come to the ER because he felt overwhelmed with life, but he would not elaborate.  Patient denies having any current stressors.  Patient denies having suicidality, homicidality or hallucinations.  He denies having issues with mood, appetite, energy or concentration.  Patient currently lives with his mother in [place]. He plans to return there when discharged.  He denies having insurance or disability but says he applied 6 months ago by phone. As far as psychiatric services he says that he does go to [facility], but he cannot tell me when he last followed up with them. abuse: drinks 3 beers 3 times per week.  Denies using any illicit substances. Smokes 3 cigarettes per day.  PSYCHIATRIC HISTORY: History of schizophrenia and has had several admissions just over the last couple of years. Has a history of noncompliance with medication outside the hospital, which has caused decompensation. Part of this is because of his own denial of his illness and his refusal to get insurance coverage. He has responded well to a combination of Invega and lithium in the past. No known history of suicidal or violent behavior.  MEDICAL HISTORY: The patient has mild asthma. Otherwise, no significant ongoing medical problems.  HISTORY: None known.  HISTORY: He gives his mother's address as the place where he lives. Says that he mostly stays around the house. He will not give me  any more detail about his current social situation.   MEDICATIONS: none  SHELLFISH.   OF SYSTEMS: denies nausea, vomiting or having any physical complaints.  The rest of the 10 review of systems is negative. STATUS EXAMINATION: A somewhat disheveled man, looks like he has not been bathing or taking care of himself well recently. Although he interacts with me, he makes almost no eye contact at all. He is curled up in a fetal position on a stretcher. Psychomotor activity very limited. Speech is very quiet and minimal in amount. Answers most questions with only a couple of words. Affect is flat and looks anxious and frightened to me intermittently. Mood is stated as being overwhelmed. Thoughts are marked by thought blocking, paucity of thought. Denies hallucinations. Denies suicidal or homicidal ideation. Can repeat 3 words immediately, remembers 3 of them at three minutes. His judgment and insight is adequate at least in coming to the hospital. He is alert and oriented x4.  EXAM: **Vital Signs.:   23-Feb-16 06:32  .?Vital Signs Type: Routine  .?Temperature Temperature (F): 98.5  .?Celsius: 36.9  .?Pulse Pulse: 89  .?Respirations Respirations: 18  .?Systolic BP Systolic BP: 195  .?Diastolic BP (mmHg) Diastolic BP (mmHg): 84  .?Pulse Pulse Sitting: 89  .?Systolic BP Systolic BP: 093  .?Diastolic BP (mmHg) Diastolic BP (mmHg): 90  .?Pulse Standing Pulse Standing: 93   General: 34 y/o AAM in no acute distressnl gait and muscular tone, no evidence of involuntary movements.    Labs:  Lab Results: Thyroid:  22-Feb-16 11:30   Thyroid Stimulating Hormone 1.88 (0.45-4.50= International Unit) -----------------------patients have reference for TSH: - - - - - - - - - - first trimetser: 0.36 - 2.50 uIU/mL)  TDMs:  22-Feb-16 11:30   Lithium, Serum  < 0.20 (0.60-1.20>= 1.50 mmol/L)  General Ref:  22-Feb-16 11:30   Acetaminophen, Serum < 2.0 (10-30TOXIC: > 200 mcg/mL > 50 mcg/mL at 12 hr  after ingestion > 300 mcg/mL at 4 hr after ingestion)  Salicylates, Serum 2.6 (0.0-2.82.8-20.0 mg/dL>30.0 mg/dL)  Routine Chem:  22-Feb-16 11:30   Ethanol, S. 4 (Result(s) reported on 12 Nov 2014 at 01:10PM.)  Glucose, Serum  117  BUN 8  Creatinine (comp) 1.17  Sodium, Serum 138  Potassium, Serum 4.2  Chloride, Serum 107  CO2, Serum 28  Calcium (Total), Serum 9.5  Anion Gap  3  Osmolality (calc) 275  eGFR (African American) >60  eGFR (Non-African American) >60 (eGFR values <66m/min/1.73 m2 may be an indication of chronicdisease (CKD).eGFR, using the MRDR Study equation, is useful in with stable renal function.eGFR calculation will not be reliable in acutely ill patientsserum creatinine is changing rapidly. It is not useful inon dialysis. The eGFR calculation may not be applicablepatients at the low and high extremes of body sizes, pregnantand vegetarians.)  Routine Hem:  22-Feb-16 11:30   WBC (CBC) 7.0  RBC (CBC) 5.19  Hemoglobin (CBC) 15.8  Hematocrit (CBC) 46.3  Platelet Count (CBC) 223 (Result(s) reported on 12 Nov 2014 at 12:47PM.)  MCV 89  MCH 30.5  MCHC 34.1  RDW 13.5    DIAGNOSIS:I: Schizophrenia, tobacco use disorder moderate III: Mild asthma.   ASSESSMENT: A 34year old man with a history of schizophrenia or schizoaffective disorder.  Patient present to the ER requesting to be restarted on his psychiatric medications.  Patient has a long h/o noncompliance.  He also has refused to cooperate with getting Medicaid /disability as he does not believe he needs them.  He has had multiple prior hospitalizations due to poor adherence to medications.  He is vague and only semi cooperative with assessment.  He displays self-care deficits as evidence by poor hygiene and strong body odor. Since admission he has been secluded to his room.   PLAN: pt received Invega sustenna 234 mg IM on 2/22 in the ER.  He also has been started on invega oral 6 mg po bid.  will continue Ativan 2 mg  po q 6 h prn use: will offer nicotrol inhaler continue vistaril 50 mg po qhs prn will check TSH, lipid panel and HbA1c. planning: will d/c back to his mother?s house when stable.  Will attempt to refer pt to ACT team if IPRS founding is available.    Electronic Signatures: HHildred Priest(MD)  (Signed on 23-Feb-16 12:59)  Authored  Last Updated: 23-Feb-16 12:59 by HHildred Priest(MD)

## 2018-11-02 DIAGNOSIS — D709 Neutropenia, unspecified: Secondary | ICD-10-CM | POA: Insufficient documentation

## 2018-11-02 DIAGNOSIS — E559 Vitamin D deficiency, unspecified: Secondary | ICD-10-CM | POA: Insufficient documentation

## 2020-02-19 ENCOUNTER — Other Ambulatory Visit: Payer: Self-pay

## 2020-02-19 ENCOUNTER — Emergency Department
Admission: EM | Admit: 2020-02-19 | Discharge: 2020-02-19 | Disposition: A | Discharge status: Home / Self Care | Payer: Medicare Other | Attending: Student in an Organized Health Care Education/Training Program | Admitting: Student in an Organized Health Care Education/Training Program

## 2020-02-19 ENCOUNTER — Encounter: Payer: Self-pay | Admitting: Emergency Medicine

## 2020-02-19 DIAGNOSIS — Z79899 Other long term (current) drug therapy: Secondary | ICD-10-CM | POA: Diagnosis not present

## 2020-02-19 DIAGNOSIS — F17210 Nicotine dependence, cigarettes, uncomplicated: Secondary | ICD-10-CM | POA: Insufficient documentation

## 2020-02-19 DIAGNOSIS — F200 Paranoid schizophrenia: Secondary | ICD-10-CM

## 2020-02-19 DIAGNOSIS — F320 Major depressive disorder, single episode, mild: Secondary | ICD-10-CM | POA: Insufficient documentation

## 2020-02-19 DIAGNOSIS — F209 Schizophrenia, unspecified: Secondary | ICD-10-CM

## 2020-02-19 DIAGNOSIS — R209 Unspecified disturbances of skin sensation: Secondary | ICD-10-CM | POA: Insufficient documentation

## 2020-02-19 HISTORY — DX: Schizophrenia, unspecified: F20.9

## 2020-02-19 LAB — COMPREHENSIVE METABOLIC PANEL
ALT: 31 U/L (ref 0–44)
AST: 24 U/L (ref 15–41)
Albumin: 4 g/dL (ref 3.5–5.0)
Alkaline Phosphatase: 50 U/L (ref 38–126)
Anion gap: 5 (ref 5–15)
BUN: 7 mg/dL (ref 6–20)
CO2: 29 mmol/L (ref 22–32)
Calcium: 9.1 mg/dL (ref 8.9–10.3)
Chloride: 103 mmol/L (ref 98–111)
Creatinine, Ser: 1.08 mg/dL (ref 0.61–1.24)
GFR calc Af Amer: >60 mL/min (ref >60)
GFR calc non Af Amer: >60 mL/min (ref >60)
Glucose, Bld: 98 mg/dL (ref 70–99)
Potassium: 4.1 mmol/L (ref 3.5–5.1)
Sodium: 137 mmol/L (ref 135–145)
Total Bilirubin: 0.6 mg/dL (ref 0.3–1.2)
Total Protein: 7.4 g/dL (ref 6.5–8.1)

## 2020-02-19 LAB — URINE DRUG SCREEN, QUALITATIVE (ARMC ONLY)
Amphetamines, Ur Screen: NOT DETECTED
Barbiturates, Ur Screen: NOT DETECTED
Benzodiazepine, Ur Scrn: NOT DETECTED
Cannabinoid 50 Ng, Ur ~~LOC~~: NOT DETECTED
Cocaine Metabolite,Ur ~~LOC~~: NOT DETECTED
MDMA (Ecstasy)Ur Screen: NOT DETECTED
Methadone Scn, Ur: NOT DETECTED
Opiate, Ur Screen: NOT DETECTED
Phencyclidine (PCP) Ur S: NOT DETECTED
Tricyclic, Ur Screen: NOT DETECTED

## 2020-02-19 LAB — CBC
HCT: 44.1 % (ref 39.0–52.0)
Hemoglobin: 15.9 g/dL (ref 13.0–17.0)
MCH: 30.6 pg (ref 26.0–34.0)
MCHC: 36.1 g/dL — ABNORMAL HIGH (ref 30.0–36.0)
MCV: 84.8 fL (ref 80.0–100.0)
Platelets: 266 10*3/uL (ref 150–400)
RBC: 5.2 MIL/uL (ref 4.22–5.81)
RDW: 12.9 % (ref 11.5–15.5)
WBC: 4.6 10*3/uL (ref 4.0–10.5)
nRBC: 0 % (ref 0.0–0.2)

## 2020-02-19 LAB — ETHANOL: Alcohol, Ethyl (B): <10 mg/dL (ref <10)

## 2020-02-19 LAB — ACETAMINOPHEN LEVEL: Acetaminophen (Tylenol), Serum: <10 ug/mL — ABNORMAL LOW (ref 10–30)

## 2020-02-19 LAB — SALICYLATE LEVEL: Salicylate Lvl: <7 mg/dL — ABNORMAL LOW (ref 7.0–30.0)

## 2020-02-19 MED ORDER — RISPERIDONE 1 MG PO TABS
4.0000 mg | ORAL_TABLET | Freq: Two times a day (BID) | ORAL | Status: DC
  Administered 2020-02-19: 4 mg via ORAL
  Filled 2020-02-19: qty 4

## 2020-02-19 MED ORDER — RISPERIDONE 2 MG PO TABS: 4.0000 mg | ORAL_TABLET | Freq: Two times a day (BID) | ORAL | 0 refills | Status: DC

## 2020-02-19 MED ORDER — RISPERIDONE 2 MG PO TABS
4.0000 mg | ORAL_TABLET | Freq: Two times a day (BID) | ORAL | 0 refills | Status: DC
Start: 2020-02-19 — End: 2020-02-19

## 2020-02-19 NOTE — ED Triage Notes (Addendum)
Pt has been out of his meds for 2 days. Hx schizophrenia.  Pt denies SI/HI.  States "my mood has been off the past couple days".  Recently released from [facility].  Denies auditory or visual hallucination

## 2020-02-19 NOTE — ED Provider Notes (Signed)
[facility] Emergency Department Provider Note    First MD Initiated Contact with Patient 02/19/20 [phone]     (approximate)  I have reviewed the triage vital signs and the nursing notes.   HISTORY  Chief Complaint Psychiatric Evaluation    HPI Kenan Val Eagle Miske is a 39 y.o. male with history of schizophrenia status post recent admission and recent discharge from [facility] presents to the ER for racing thoughts. Denies any SI or HI. Denies any hallucinations. States he stopped taking his medications 2 days ago. Cannot give a good explanation as to why. Does appear somewhat anxious. States that he cannot stop or control his thoughts.    Past Medical History:  Diagnosis Date  . Schizophrenia (HCC)    History reviewed. No pertinent family history. History reviewed. No pertinent surgical history. There are no problems to display for this patient.     Prior to Admission medications   Not on File    Allergies Patient has no known allergies.    Social History Social History   Tobacco Use  . Smoking status: Current Every Day Smoker  . Smokeless tobacco: Never Used  Substance Use Topics  . Alcohol use: Never  . Drug use: Never    Review of Systems Patient denies headaches, rhinorrhea, blurry vision, numbness, shortness of breath, chest pain, edema, cough, abdominal pain, nausea, vomiting, diarrhea, dysuria, fevers, rashes or hallucinations unless otherwise stated above in HPI. ____________________________________________   PHYSICAL EXAM:  VITAL SIGNS: Vitals:   02/19/20 0844  BP: 124/64  Pulse: 97  Resp: 18  Temp: 98.7 F (37.1 C)  SpO2: 98%    Constitutional: Alert and oriented.  Eyes: Conjunctivae are normal.  Head: Atraumatic. Nose: No congestion/rhinnorhea. Mouth/Throat: Mucous membranes are moist.   Neck: No stridor. Painless ROM.  Cardiovascular: Normal rate, regular rhythm. Grossly normal heart  sounds.  Good peripheral circulation. Respiratory: Normal respiratory effort.  No retractions. Lungs CTAB. Gastrointestinal: Soft and nontender. No distention. No abdominal bruits. No CVA tenderness. Genitourinary:  Musculoskeletal: No lower extremity tenderness nor edema.  No joint effusions. Neurologic:  Normal speech and language. No gross focal neurologic deficits are appreciated. No facial droop Skin:  Skin is warm, dry and intact. No rash noted. Psychiatric: cooperative, slightly pressured speech, anxious appearing  ____________________________________________   LABS (all labs ordered are listed, but only abnormal results are displayed)  Results for orders placed or performed during the hospital encounter of 02/19/20 (from the past 24 hour(s))  cbc     Status: Abnormal   Collection Time: 02/19/20  8:49 AM  Result Value Ref Range   WBC 4.6 4.0 - 10.5 K/uL   RBC 5.20 4.22 - 5.81 MIL/uL   Hemoglobin 15.9 13.0 - 17.0 g/dL   HCT 76.1 95.0 - 93.2 %   MCV 84.8 80.0 - 100.0 fL   MCH 30.6 26.0 - 34.0 pg   MCHC 36.1 (H) 30.0 - 36.0 g/dL   RDW 67.1 24.5 - 80.9 %   Platelets 266 150 - 400 K/uL   nRBC 0.0 0.0 - 0.2 %  Urine Drug Screen, Qualitative     Status: None   Collection Time: 02/19/20  8:49 AM  Result Value Ref Range   Tricyclic, Ur Screen NONE DETECTED NONE DETECTED   Amphetamines, Ur Screen NONE DETECTED NONE DETECTED   MDMA (Ecstasy)Ur Screen NONE DETECTED NONE DETECTED   Cocaine Metabolite,Ur Gann NONE DETECTED NONE DETECTED   Opiate, Ur Screen NONE DETECTED NONE  DETECTED   Phencyclidine (PCP) Ur S NONE DETECTED NONE DETECTED   Cannabinoid 50 Ng, Ur Sabine NONE DETECTED NONE DETECTED   Barbiturates, Ur Screen NONE DETECTED NONE DETECTED   Benzodiazepine, Ur Scrn NONE DETECTED NONE DETECTED   Methadone Scn, Ur NONE DETECTED NONE DETECTED    ____________________________________________  EKG____________________________________________  RADIOLOGY   ____________________________________________   PROCEDURES  Procedure(s) performed:  Procedures    Critical Care performed: no ____________________________________________   INITIAL IMPRESSION / ASSESSMENT AND PLAN / ED COURSE  Pertinent labs & imaging results that were available during my care of the patient were reviewed by me and considered in my medical decision making (see chart for details).   DDX: Psychosis, delirium, medication effect, noncompliance, polysubstance abuse, Si, Hi, depression   Truitt O Dickenson is a 39 y.o. who presents to the ED with for evaluation of racing thoughts.  Patient has psych history of schizophrenia.  Laboratory testing was ordered to evaluation for underlying electrolyte derangement or signs of underlying organic pathology to explain today's presentation.  Based on history and physical and laboratory evaluation, it appears that the patient's presentation is 2/2 underlying psychiatric disorder and will require further evaluation and management by inpatient psychiatry.  Disposition pending psychiatric evaluation.  The patient has been placed in psychiatric observation due to the need to provide a safe environment for the patient while obtaining psychiatric consultation and evaluation, as well as ongoing medical and medication management to treat the patient's condition.  The patient has not been placed under full IVC at this time.       The patient was evaluated in Emergency Department today for the symptoms described in the history of present illness. He/she was evaluated in the context of the global COVID-19 pandemic, which necessitated consideration that the patient might be at risk for infection with the SARS-CoV-2 virus that causes COVID-19. Institutional protocols and algorithms that pertain to the evaluation of patients at risk  for COVID-19 are in a state of rapid change based on information released by regulatory bodies including the CDC and federal and state organizations. These policies and algorithms were followed during the patient's care in the ED.  As part of my medical decision making, I reviewed the following data within the [place] notes reviewed and incorporated, Labs reviewed, notes from prior ED visits and [place] Controlled Substance Database   ____________________________________________   FINAL CLINICAL IMPRESSION(S) / ED DIAGNOSES  Final diagnoses:  Schizophrenia, unspecified type ([place])      NEW MEDICATIONS STARTED DURING THIS VISIT:  New Prescriptions   No medications on file     Note:  This document was prepared using Dragon voice recognition software and may include unintentional dictation errors.    Merlyn Lot, MD 02/19/20 [phone]

## 2020-02-19 NOTE — ED Notes (Signed)
E-signature not working at this time. Pt verbalized understanding of D/C instructions, prescriptions and follow up care with no further questions at this time. Pt in NAD and ambulatory at time of D/C. Pt voluntary and driving self at home. Pt aware to follow up with ACT team after discharge.

## 2020-02-19 NOTE — ED Notes (Addendum)
Dressed out by this [place] surgeon NT  1 t shirt 1 pair pants 1 hat 1 pair sandals 1 cell phone Keys Medication in bag.

## 2020-02-19 NOTE — Consult Note (Signed)
[facility] Psych ED Progress Note  02/19/2020 1:19 PM Clinton Hart  MRN:  [id number] Principal Problem: Schizophrenia  Total Time spent with patient: 20 minutes    Subjective:  HPI: The patient is a 39 year old african [place] male with history of schizophrenia. He recently ran out of his outpatient medication risperdal two days ago and has noticed that he has become increasingly "afraid" and feels "on edge". He reported he is followed by ACT services and is in an independent living situation. He reported he was hospitalized at [facility] for roughly six months and was discharged earlier this year in March. He brought an empty pill pack that had a presciption for risperdal 8mg  PO Q daily (two 4mg  tabs). He requested to have his medications adjusted, and was receptive to restarting his medications and getting in touch with his ACT team. He was concerned that he would be hospitalized in [place], but was informed that he currently does not meet criteria.   Past Psychiatric History: Per chart review, multiple psychiatric hospitalizations. His last admission at [facility] was in 2016. Last reported hospitalization was an extended stay at [facility]. Denies past SA.  Past Medical History:  Past Medical History:  Diagnosis Date  . Schizophrenia (HCC)    History reviewed. No pertinent surgical history. Family History: History reviewed. No pertinent family history. Family Psychiatric  History: unknown Social History:  Act services, unemployed, living independently with support services. Social History   Substance and Sexual Activity  Alcohol Use Never     Social History   Substance and Sexual Activity  Drug Use Never    Social History   Socioeconomic History  . Marital status: Single    Spouse name: Not on file  . Number of children: Not on file  . Years of education: Not on file  . Highest education level: Not on file  Occupational History  . Not on file   Tobacco Use  . Smoking status: Current Every Day Smoker  . Smokeless tobacco: Never Used  Substance and Sexual Activity  . Alcohol use: Never  . Drug use: Never  . Sexual activity: Not on file  Other Topics Concern  . Not on file  Social History Narrative  . Not on file   Social Determinants of Health   Financial Resource Strain:   . Difficulty of Paying Living Expenses:   Food Insecurity:   . Worried About 2017 in the Last Year:   . [facility] in the Last Year:   Transportation Needs:   . Programme researcher, broadcasting/film/video (Medical):   Barista Lack of Transportation (Non-Medical):   Physical Activity:   . Days of Exercise per Week:   . Minutes of Exercise per Session:   Stress:   . Feeling of Stress :   Social Connections:   . Frequency of Communication with Friends and Family:   . Frequency of Social Gatherings with Friends and Family:   . Attends Religious Services:   . Active Member of Clubs or Organizations:   . Attends Freight forwarder Meetings:   Marland Kitchen Marital Status:     Sleep: Fair  Appetite:  Fair  Current Medications: Current Facility-Administered Medications  Medication Dose Route Frequency Provider Last Rate Last Admin  . risperiDONE (RISPERDAL) tablet 4 mg  4 mg Oral BID Banker, MD   4 mg at 02/19/20 1305   Current Outpatient Medications  Medication Sig Dispense Refill  . risperiDONE (RISPERDAL)  2 MG tablet Take 2 tablets (4 mg total) by mouth 2 (two) times daily. 60 tablet 0    Lab Results:  Results for orders placed or performed during the hospital encounter of 02/19/20 (from the past 48 hour(s))  Comprehensive metabolic panel     Status: None   Collection Time: 02/19/20  8:49 AM  Result Value Ref Range   Sodium 137 135 - 145 mmol/L   Potassium 4.1 3.5 - 5.1 mmol/L   Chloride 103 98 - 111 mmol/L   CO2 29 22 - 32 mmol/L   Glucose, Bld 98 70 - 99 mg/dL    Comment: Glucose reference range applies only to samples taken after  fasting for at least 8 hours.   BUN 7 6 - 20 mg/dL   Creatinine, Ser 1.08 0.61 - 1.24 mg/dL   Calcium 9.1 8.9 - 10.3 mg/dL   Total Protein 7.4 6.5 - 8.1 g/dL   Albumin 4.0 3.5 - 5.0 g/dL   AST 24 15 - 41 U/L   ALT 31 0 - 44 U/L   Alkaline Phosphatase 50 38 - 126 U/L   Total Bilirubin 0.6 0.3 - 1.2 mg/dL   GFR calc non Af Amer >60 >60 mL/min   GFR calc Af Amer >60 >60 mL/min   Anion gap 5 5 - 15    Comment: Performed at [facility], [address], [place], [place] [address]  Ethanol     Status: None   Collection Time: 02/19/20  8:49 AM  Result Value Ref Range   Alcohol, Ethyl (B) <10 <10 mg/dL    Comment: (NOTE) Lowest detectable limit for serum alcohol is 10 mg/dL. For medical purposes only. Performed at [facility], [address] [place], [place] [address]   Salicylate level     Status: Abnormal   Collection Time: 02/19/20  8:49 AM  Result Value Ref Range   Salicylate Lvl <6.7 (L) 7.0 - 30.0 mg/dL    Comment: Performed at [facility], [place], [place] [address]  Acetaminophen level     Status: Abnormal   Collection Time: 02/19/20  8:49 AM  Result Value Ref Range   Acetaminophen (Tylenol), Serum <10 (L) 10 - 30 ug/mL    Comment: (NOTE) Therapeutic concentrations vary significantly. A range of 10-30 ug/mL  may be an effective concentration for many patients. However, some  are best treated at concentrations outside of this range. Acetaminophen concentrations >150 ug/mL at 4 hours after ingestion  and >50 ug/mL at 12 hours after ingestion are often associated with  toxic reactions. Performed at [facility], [address] [place], [place] [address]   cbc     Status: Abnormal   Collection Time: 02/19/20  8:49 AM  Result Value Ref Range   WBC 4.6 4.0 - 10.5 K/uL   RBC 5.20 4.22 - 5.81 MIL/uL   Hemoglobin 15.9 13.0 - 17.0 g/dL   HCT 44.1 39.0 - 52.0 %   MCV 84.8 80.0 - 100.0 fL   MCH 30.6 26.0 - 34.0 pg    MCHC 36.1 (H) 30.0 - 36.0 g/dL   RDW 12.9 11.5 - 15.5 %   Platelets 266 150 - 400 K/uL   nRBC 0.0 0.0 - 0.2 %    Comment: Performed at [facility], [address] [place], [place] [address]  Urine Drug Screen, Qualitative     Status: None   Collection Time: 02/19/20  8:49 AM  Result Value Ref  Range   Tricyclic, Ur Screen NONE DETECTED NONE DETECTED   Amphetamines, Ur Screen NONE DETECTED NONE DETECTED   MDMA (Ecstasy)Ur Screen NONE DETECTED NONE DETECTED   Cocaine Metabolite,Ur Leslie NONE DETECTED NONE DETECTED   Opiate, Ur Screen NONE DETECTED NONE DETECTED   Phencyclidine (PCP) Ur S NONE DETECTED NONE DETECTED   Cannabinoid 50 Ng, Ur Rogers NONE DETECTED NONE DETECTED   Barbiturates, Ur Screen NONE DETECTED NONE DETECTED   Benzodiazepine, Ur Scrn NONE DETECTED NONE DETECTED   Methadone Scn, Ur NONE DETECTED NONE DETECTED    Comment: (NOTE) Tricyclics + metabolites, urine    Cutoff 1000 ng/mL Amphetamines + metabolites, urine  Cutoff 1000 ng/mL MDMA (Ecstasy), urine              Cutoff 500 ng/mL Cocaine Metabolite, urine          Cutoff 300 ng/mL Opiate + metabolites, urine        Cutoff 300 ng/mL Phencyclidine (PCP), urine         Cutoff 25 ng/mL Cannabinoid, urine                 Cutoff 50 ng/mL Barbiturates + metabolites, urine  Cutoff 200 ng/mL Benzodiazepine, urine              Cutoff 200 ng/mL Methadone, urine                   Cutoff 300 ng/mL The urine drug screen provides only a preliminary, unconfirmed analytical test result and should not be used for non-medical purposes. Clinical consideration and professional judgment should be applied to any positive drug screen result due to possible interfering substances. A more specific alternate chemical method must be used in order to obtain a confirmed analytical result. Gas chromatography / mass spectrometry (GC/MS) is the preferred confirmat ory method. Performed at [facility], [address]  [place], [place] [address]     Blood Alcohol level:  Lab Results  Component Value Date   [facility] <10 02/19/2020    Physical Findings: AIMS:  , ,  ,  ,    CIWA:    COWS:     Musculoskeletal: Strength & Muscle Tone: within normal limits Gait & Station: did not assess Patient leans: N/A  Psychiatric Specialty Exam: Physical Exam  Review of Systems  Blood pressure 124/64, pulse 97, temperature 98.7 F (37.1 C), temperature source Oral, resp. rate 18, height 5\' 9"  (1.753 m), weight 81.6 kg, SpO2 98 %.Body mass index is 26.58 kg/m.  General Appearance: Casual  Eye Contact:  Fair  Speech:  Clear and Coherent  Volume:  Normal  Mood:  "I don't know, nervous"  Affect:  Blunt  Thought Process:  Overall linear, concrete  Orientation:  Full (Time, Place, and Person)  Thought Content: notable for paranoia, denies AVH  Suicidal Thoughts:  No  Homicidal Thoughts:  No  Memory:  Immediate;   Fair Recent;   Fair  Judgement:  Fair  Insight:  Present  Psychomotor Activity:  Normal  Concentration:  Concentration: Fair  Recall:  of Knowledge:  Fair  Language:  Fair  Akathisia:  No  Handed:   AIMS (if indicated):     Assets:  Financial Resources/Insurance Housing Social Support  ADL's:  Intact  Cognition:  WNL  Sleep:        Diagnosis:  Schizophrenia    Treatment Plan Summary:  -Would recommend restarting the patients risperdal 8mg  po Q Daily, if  well tolerated would discharge with medication to get him through till his next meeting with his ACT provider.  -Will follow up with the patients ACT services  - Currently the patient does not meet criteria for inpatient admission  -Thank you for the privilege of participating in the care of this patient.   Luciano Cutter, DO 02/19/2020, 1:19 PM

## 2020-02-19 NOTE — ED Notes (Signed)
Pt given lunch tray and ginger ale to drink. 

## 2020-02-19 NOTE — ED Notes (Signed)
Pt up to use the restroom with no help needed from staff

## 2020-02-19 NOTE — BH Assessment (Signed)
Assessment Note  Clinton Hart is an 39 y.o. male who presented to [facility] ED voluntarily for treatment. Per triage note, Pt has been out of his meds for 2 days. Hx schizophrenia.  Pt denies SI/HI.  States "my mood has been off the past couple days".  Recently released from [place].  Denies auditory or visual hallucination.  During TTS assessment pt presented pleasant, calm and oriented x 3. Pt confirmed the information provided to triage RN. Pt identified his main complaint to be medication management. Pt reports noncompliance with medications for two weeks and experiencing rapid mood swings and racing thoughts as a result. Pt reports noncompliance with medications due to being unable to refill his prescriptions. Pt reports an inpatient treatment hx in [place] 1-2 years ago. Pt reports to have a ACT team but is currently unable to provide any names or contact information. Pt denies any depression symptoms or substance use. Pt reports to live alone and to be actively involved with his ACT team. Pt denies any current SI/HI/AH/VH and was able to contract his safety stating "If I can get back on my meds I will be good". Pt provided Nathanial Millman [phone]) as collateral contact.   Collateral contact: Marylene Land Identified herself to be the pt cousin. Marylene Land confirmed talking to pt by phone yesterday and his last visit to her home to be Friday. Marylene Land identified her main concerns for pt to be his lack of meeting his personal hygiene needs and increased calls to borrow money over the last two weeks. Marylene Land confirmed patient reporting to have sponsors but is unsure of the agency's name or services being received. Marylene Land described pt to be pleasant and respectful and expressed no concerns with pt endorsing SI/HI/ VH/AH. Marylene Land reported no further concerns and requested pt contact her as soon as possible.   Per Dr. Donata Clay pt is recommended for overnight observation to be reassessed in the morning for medication  management   Diagnosis: Schizophrenia, unspecified type    Past Medical History:  Past Medical History:  Diagnosis Date  . Schizophrenia (HCC)     History reviewed. No pertinent surgical history.  Family History: History reviewed. No pertinent family history.  Social History:  reports that he has been smoking. He has never used smokeless tobacco. He reports that he does not drink alcohol or use drugs.  Additional Social History:  Alcohol / Drug Use Pain Medications: see mar Prescriptions: see mar Over the Counter: see mar History of alcohol / drug use?: No history of alcohol / drug abuse  CIWA: CIWA-Ar BP: 124/64 Pulse Rate: 97 COWS:    Allergies: No Known Allergies  Home Medications: (Not in a hospital admission)   OB/GYN Status:  No LMP for male patient.  General Assessment Data Location of Assessment: [facility] ED TTS Assessment: In system Is this a Tele or Face-to-Face Assessment?: Face-to-Face Is this an Initial Assessment or a Re-assessment for this encounter?: Initial Assessment Patient Accompanied by:: N/A Language Other than English: No Living Arrangements: Other (Comment) What gender do you identify as?: Male Marital status: Single Maiden name: n/a Pregnancy Status: No Living Arrangements: Alone Can pt return to current living arrangement?: Yes Admission Status: Voluntary Is patient capable of signing voluntary admission?: Yes Referral Source: Self/Family/Friend Insurance type: Medicare  Medical Screening Exam [facility] Walk-in ONLY) Medical Exam completed: Yes  Crisis Care Plan Living Arrangements: Alone Legal Guardian: (None reported ) Name of Psychiatrist: None reported (Pt reports to have an ACT team but unable to  provide info) Name of Therapist: None reported (Pt reports to have an ACT team but unable to provide info)  Education Status Is patient currently in school?: No Is the patient employed, unemployed or receiving disability?: Receiving  disability income  Risk to self with the past 6 months Suicidal Ideation: No Has patient been a risk to self within the past 6 months prior to admission? : No Suicidal Intent: No Has patient had any suicidal intent within the past 6 months prior to admission? : No Is patient at risk for suicide?: No, but patient needs Medical Clearance Suicidal Plan?: No Has patient had any suicidal plan within the past 6 months prior to admission? : No Access to Means: No What has been your use of drugs/alcohol within the last 12 months?: None reported  Previous Attempts/Gestures: No How many times?: 0 Other Self Harm Risks: None reported  Triggers for Past Attempts: None known Intentional Self Injurious Behavior: None Family Suicide History: Unknown Recent stressful life event(s): (None reported ) Persecutory voices/beliefs?: No Depression: No Depression Symptoms: (None reported/observed ) Substance abuse history and/or treatment for substance abuse?: No Suicide prevention information given to non-admitted patients: Not applicable  Risk to Others within the past 6 months Homicidal Ideation: No Does patient have any lifetime risk of violence toward others beyond the six months prior to admission? : No Thoughts of Harm to Others: No Current Homicidal Intent: No Current Homicidal Plan: No Access to Homicidal Means: No Identified Victim: None reported  History of harm to others?: No Assessment of Violence: None Noted Violent Behavior Description: n/a Does patient have access to weapons?: No Criminal Charges Pending?: No Does patient have a court date: No Is patient on probation?: No  Psychosis Hallucinations: None noted Delusions: None noted  Mental Status Report Appearance/Hygiene: In scrubs Eye Contact: Good Motor Activity: Unremarkable Speech: Logical/coherent Level of Consciousness: Alert Mood: Anxious, Pleasant Affect: Anxious Anxiety Level: Minimal Thought Processes:  Coherent, Relevant Judgement: Partial Orientation: Person, Place, Time, Situation Obsessive Compulsive Thoughts/Behaviors: None  Cognitive Functioning Concentration: Fair Memory: Recent Intact, Remote Intact Is patient IDD: No Insight: Fair Impulse Control: Fair Appetite: Good Have you had any weight changes? : No Change Sleep: No Change Total Hours of Sleep: 8 Vegetative Symptoms: None  ADLScreening [facility] Assessment Services) Patient able to express need for assistance with ADLs?: Yes Independently performs ADLs?: Yes (appropriate for developmental age)  Prior Inpatient Therapy Prior Inpatient Therapy: Yes Prior Therapy Dates: 2019-2020 Prior Therapy Facilty/Provider(s): [place]  Reason for Treatment: MH  Prior Outpatient Therapy Prior Outpatient Therapy: No(Pt reports to have an ACT team but unable to provid info.) Does patient have an ACCT team?: Unknown(Pt reports to have one but unable to provide info.) Does patient have Intensive In-House Services?  : Unknown Does patient have [facility] services? : No Does patient have P4CC services?: No  ADL Screening (condition at time of admission) Is the patient deaf or have difficulty hearing?: No Does the patient have difficulty seeing, even when wearing glasses/contacts?: No Does the patient have difficulty concentrating, remembering, or making decisions?: No Patient able to express need for assistance with ADLs?: Yes Does the patient have difficulty dressing or bathing?: No Independently performs ADLs?: Yes (appropriate for developmental age) Does the patient have difficulty walking or climbing stairs?: No Weakness of Legs: None Weakness of Arms/Hands: None  Home Assistive Devices/Equipment Home Assistive Devices/Equipment: None  Therapy Consults (therapy consults require a physician order) PT Evaluation Needed: No OT Evalulation Needed: No SLP Evaluation Needed:  No Abuse/Neglect Assessment (Assessment to be complete  while patient is alone) Abuse/Neglect Assessment Can Be Completed: Yes Physical Abuse: Denies Verbal Abuse: Denies Sexual Abuse: Denies Exploitation of patient/patient's resources: Denies Self-Neglect: Denies Values / Beliefs Cultural Requests During Hospitalization: None Spiritual Requests During Hospitalization: None Consults Spiritual Care Consult Needed: No Transition of Care Team Consult Needed: No Advance Directives (For Healthcare) Does Patient Have a Medical Advance Directive?: No          Disposition:  Disposition Initial Assessment Completed for this Encounter: Yes Patient referred to: Other (Comment)  On Site Evaluation by:   Reviewed with Physician:    Shanon Ace 02/19/2020 3:43 PM

## 2020-02-19 NOTE — ED Notes (Signed)
Patient given breakfast tray.

## 2020-02-20 ENCOUNTER — Other Ambulatory Visit: Payer: Self-pay

## 2020-02-20 ENCOUNTER — Emergency Department
Admission: EM | Admit: 2020-02-20 | Discharge: 2020-02-22 | Disposition: A | Discharge status: Other Acute Inpatient Hospital | Payer: Medicare Other | Attending: Emergency Medicine | Admitting: Emergency Medicine

## 2020-02-20 DIAGNOSIS — Z79899 Other long term (current) drug therapy: Secondary | ICD-10-CM | POA: Insufficient documentation

## 2020-02-20 DIAGNOSIS — Z20822 Contact with and (suspected) exposure to covid-19: Secondary | ICD-10-CM | POA: Insufficient documentation

## 2020-02-20 DIAGNOSIS — F200 Paranoid schizophrenia: Secondary | ICD-10-CM | POA: Insufficient documentation

## 2020-02-20 DIAGNOSIS — F17210 Nicotine dependence, cigarettes, uncomplicated: Secondary | ICD-10-CM | POA: Insufficient documentation

## 2020-02-20 DIAGNOSIS — R45851 Suicidal ideations: Secondary | ICD-10-CM | POA: Insufficient documentation

## 2020-02-20 NOTE — ED Notes (Signed)
Report received from Vanessa RN.

## 2020-02-20 NOTE — ED Triage Notes (Signed)
He arrives today via POV with reports of suicidal ideation  Pt reports "I do not feel safe living by myself - I am going to hurt myself"  Denies any specific plan of self harm   "I want to go to [place] to the hospital"

## 2020-02-20 NOTE — ED Notes (Signed)
Pt arrived to [facility] room 2. Appears calm and cooperative. Verbalized understanding rules of BHU. Lights and tv off to enhance rest.

## 2020-02-20 NOTE — ED Triage Notes (Addendum)
First nurse note- pt here yesterday and wanting to be sent to [place].  Back for same. Pt placed in chairs in triage area to wait for triage for safety.

## 2020-02-20 NOTE — ED Notes (Signed)
Patient belongings: 1 white t-shirt 1 pair red [employer]idas slides 1 pair gray/blue sweatpants [employer] keys 2 ID's LG phone

## 2020-02-20 NOTE — ED Provider Notes (Signed)
[facility] Emergency Department Provider Note ____________________________________________   First MD Initiated Contact with Patient 02/20/20 1812     (approximate)  I have reviewed the triage vital signs and the nursing notes.   HISTORY  Chief Complaint Suicidal    HPI Clinton Hart is a 39 y.o. male with PMH of schizophrenia who presents with suicidal ideation over the last several days, gradual onset, not associated with any specific plan to harm himself.  The patient was here yesterday for racing thoughts.  He states that at that time he denied SI because he was too embarrassed to tell the provider.  He had stopped taking his Risperdal, but was restarted on it yesterday and states that he took it yesterday and today but feels no better.  He denies any acute medical complaints.  Past Medical History:  Diagnosis Date  . Schizophrenia (HCC)     There are no problems to display for this patient.   No past surgical history on file.  Prior to Admission medications   Medication Sig Start Date End Date Taking? Authorizing Provider  risperiDONE (RISPERDAL) 2 MG tablet Take 2 tablets (4 mg total) by mouth 2 (two) times daily. 02/19/20 02/18/21  Emily Filbert, MD    Allergies Shellfish allergy  No family history on file.  Social History Social History   Tobacco Use  . Smoking status: Current Every Day Smoker  . Smokeless tobacco: Never Used  Substance Use Topics  . Alcohol use: Never  . Drug use: Never    Review of Systems  Constitutional: No fever. Eyes: No redness. ENT: No sore throat. Cardiovascular: Denies chest pain. Respiratory: Denies shortness of breath. Gastrointestinal: No vomiting. Genitourinary: Negative for dysuria.  Musculoskeletal: Negative for back pain. Skin: Negative for rash. Neurological: Negative for headache.   ____________________________________________   PHYSICAL EXAM:  VITAL SIGNS: ED Triage  Vitals  Enc Vitals Group     BP 02/20/20 1826 134/75     Pulse Rate 02/20/20 1826 (!) 105     Resp 02/20/20 1826 18     Temp 02/20/20 1826 98.6 F (37 C)     Temp Source 02/20/20 1826 Oral     SpO2 02/20/20 1826 97 %     Weight 02/20/20 1829 180 lb (81.6 kg)     Height 02/20/20 1829 5\' 9"  (1.753 m)     Head Circumference --      Peak Flow --      Pain Score 02/20/20 1829 0     Pain Loc --      Pain Edu? --      Excl. in GC? --     Constitutional: Alert and oriented. Well appearing and in no acute distress. Eyes: Conjunctivae are normal.  Head: Atraumatic. Nose: No congestion/rhinnorhea. Mouth/Throat: Mucous membranes are moist.   Neck: Normal range of motion.  Cardiovascular: Normal rate, regular rhythm.  Good peripheral circulation. Respiratory: Normal respiratory effort.  No retractions.  Gastrointestinal: No distention.  Musculoskeletal: Extremities warm and well perfused.  Neurologic:  Normal speech and language. No gross focal neurologic deficits are appreciated.  Skin:  Skin is warm and dry. No rash noted. Psychiatric: Mood and affect are normal. Speech and behavior are normal.  ____________________________________________   LABS (all labs ordered are listed, but only abnormal results are displayed)  Labs Reviewed  SARS CORONAVIRUS 2 BY RT PCR (HOSPITAL ORDER, PERFORMED IN [facility])   ____________________________________________  EKG   ____________________________________________  RADIOLOGY  ____________________________________________   PROCEDURES  Procedure(s) performed: No  Procedures  Critical Care performed: No ____________________________________________   INITIAL IMPRESSION / ASSESSMENT AND PLAN / ED COURSE  Pertinent labs & imaging results that were available during my care of the patient were reviewed by me and considered in my medical decision making (see chart for details).  39 year old male with a history of  schizophrenia presents for SI with no specific plan, however he states he feels unsafe and cannot contract for safety.  He feels he is in imminent danger to himself.  He reports that he has had racing thoughts as well.  I reviewed the past medical records in Epic; the patient was seen in the ED yesterday for racing thoughts but denied SI at that time.  He was restarted on his Risperdal which she had not taken for several days.  On exam, he is overall well-appearing.  His vital signs are normal except for borderline tachycardia at triage.  The physical exam is otherwise unremarkable.  He is calm and cooperative at this time.  Because the patient feels he is an imminent danger to self and reports SI I have placed him under involuntary commitment.  I have consulted psychiatry.  Disposition will be based on psychiatry recommendations.  ----------------------------------------- 12:28 AM on 02/21/2020 -----------------------------------------  Patient is still pending psychiatry evaluation.  I have signed him out to the oncoming physician Dr. Karma Greaser. ____________________________________________   FINAL CLINICAL IMPRESSION(S) / ED DIAGNOSES  Final diagnoses:  Suicidal ideation      NEW MEDICATIONS STARTED DURING THIS VISIT:  New Prescriptions   No medications on file     Note:  This document was prepared using Dragon voice recognition software and may include unintentional dictation errors.    Arta Silence, MD 02/21/20 [phone]

## 2020-02-21 LAB — SARS CORONAVIRUS 2 BY RT PCR (HOSPITAL ORDER, PERFORMED IN ~~LOC~~ HOSPITAL LAB): SARS Coronavirus 2: NEGATIVE

## 2020-02-21 MED ORDER — DULOXETINE HCL 20 MG PO CPEP
20.0000 mg | ORAL_CAPSULE | Freq: Every day | ORAL | Status: DC
  Administered 2020-02-21 – 2020-02-22 (×2): 20 mg via ORAL
  Filled 2020-02-21 (×2): qty 1

## 2020-02-21 MED ORDER — BENZTROPINE MESYLATE 0.5 MG PO TABS
0.2500 mg | ORAL_TABLET | Freq: Every day | ORAL | Status: DC
  Administered 2020-02-22: 0.25 mg via ORAL
  Filled 2020-02-21 (×2): qty 1

## 2020-02-21 MED ORDER — RISPERIDONE 1 MG PO TABS
2.0000 mg | ORAL_TABLET | Freq: Every day | ORAL | Status: DC
  Administered 2020-02-21: 2 mg via ORAL

## 2020-02-21 MED ORDER — RISPERIDONE 0.25 MG PO TABS
0.2500 mg | ORAL_TABLET | Freq: Every day | ORAL | Status: DC
  Administered 2020-02-21: 0.25 mg via ORAL
  Filled 2020-02-21 (×2): qty 1

## 2020-02-21 NOTE — Consult Note (Signed)
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 male patient admitted with  Acute SI with possible plans.  He has strong thoughts to harm self and called for ER visit.  He lost his parents several years ago ---Mom passed away from cancer, Dad passed away from "natural causes"--Lives alone on his disability check and is on the ACT team but there is very little activities at this time.  It is not clear if he has been taking his oral medications.    He has not had depot injectables, and not clear if he has been stashing meds at home either.   ITT phone call to ACT team pending.   Patient feels he has no structure and has not been able to attend school either --unclear complexity of these classes at this time   He has never had grief counseling and or support   He has depressed mood, crying spells, lack of appetite, enthusiasm, mood, lack of energy, motivation, concentration, lack of sleep ( three cycles)---SI with possible vague plans ---no active HI sadness, overall lack of Self esteem anhedonia symptoms and slight veg signs   HPI:  As above  In addition, patient has worsening psychosis with paranoid, fearfulness suspiciousness, possible internal distraction, hearing voices ( he is vague on this matter and return of illogical disorganized thought   He has generalized anxiety with excessive worry, nervousness tension, panic symptoms with heart racing ---sweating at times, somatic symptoms, dread doom and gloom in staying home, COVID issues also made him more isolated  as ACT team has not had many activities as well.    Not clear how his ADL's have been and day to day tasks. --as well         Past Psychiatric History:  Numerous admissions since age 53 at least ten or more including [facility] for three months.  He did okay there with structure depot injections but since discharge a few years ago has had a down hill course.   No relatives, friends and all supportive, he does not have siblings   Family history ---He is not clear and does not know if his parents or relatives had psych history   Risk to Self: Suicidal Ideation: No-Not Currently/Within Last 6 Months Suicidal Intent: No Is patient at risk for suicide?: No Suicidal Plan?: No Access to Means: No What has been your use of drugs/alcohol within the last 12 months?: none How many times?: 0(pt denies) Other Self Harm Risks: none reported Triggers for Past Attempts: None known Intentional Self Injurious Behavior: None Risk to Others: Homicidal Ideation: No Thoughts of Harm to Others: No Current Homicidal Intent: No Current Homicidal Plan: No Access to Homicidal Means: No Identified Victim: none History of harm to others?: No Assessment of Violence: None Noted Violent Behavior Description: none Does patient have access to weapons?: No Criminal Charges Pending?: No Does patient have a court date: No Prior Inpatient Therapy: Prior Inpatient Therapy: Yes Prior Therapy Dates: 2019-2020 Prior Therapy Facilty/Provider(s): [facility]  Reason for Treatment:  MH Prior Outpatient Therapy: Prior Outpatient Therapy: No Does patient have an ACCT team?: Yes Does patient have Intensive In-House Services?  : No Does patient have [facility] services? : No Does patient have P4CC services?: No  Past Medical History:  Past Medical History:  Diagnosis Date  . Schizophrenia ([place])    No past surgical history on file. Family History: No family history on file. Family Psychiatric  History:  He is not  clear or sure  Social History:  Lives alone in his house with no major supports  ACT team does come weekly but he says they do not spend much time, delivers medications and then leave --he has not been taking medications and has no major structure he says  Social History   Substance and Sexual Activity  Alcohol Use Never     Social History   Substance and Sexual Activity  Drug Use Never    Social History   Socioeconomic History  . Marital status: Single    Spouse name: Not on file  . Number of children: Not on file  . Years of education: Not on file  . Highest education level: Not on file  Occupational History  . Not on file  Tobacco Use  . Smoking status: Current Every Day Smoker  . Smokeless tobacco: Never Used  Substance and Sexual Activity  . Alcohol use: Never  . Drug use: Never  . Sexual activity: Not on file  Other Topics Concern  . Not on file  Social History Narrative  . Not on file   Social Determinants of Health   Financial Resource Strain:   . Difficulty of Paying Living Expenses:   Food Insecurity:   . Worried About Charity fundraiser in the Last Year:   . Arboriculturist in the Last Year:   Transportation Needs:   . Film/video editor (Medical):   Marland Kitchen Lack of Transportation (Non-Medical):   Physical Activity:   . Days of Exercise per Week:   . Minutes of Exercise per Session:   Stress:   . Feeling of Stress :   Social Connections:   . Frequency of Communication with Friends and Family:   . Frequency of Social Gatherings with Friends and Family:   . Attends Religious Services:   . Active Member of Clubs or Organizations:   . Attends Archivist Meetings:   Marland Kitchen Marital Status:    Additional Social History:    None for now  ITT--- researching via ACT team     Allergies:   Allergies  Allergen Reactions  . Shellfish Allergy Anaphylaxis    Labs:  Results for orders placed or performed during the hospital encounter of 02/20/20  (from the past 48 hour(s))  SARS Coronavirus 2 by RT PCR (hospital order, performed in [facility] hospital lab) Nasopharyngeal Nasopharyngeal Swab     Status: None   Collection Time: 02/20/20 11:23 PM   Specimen: Nasopharyngeal Swab  Result Value Ref Range   SARS Coronavirus 2 NEGATIVE NEGATIVE    Comment: (NOTE) SARS-CoV-2 target nucleic acids are NOT DETECTED. The SARS-CoV-2 RNA is generally detectable in upper and lower respiratory specimens during the acute phase of infection. The lowest concentration of SARS-CoV-2 viral copies this assay can detect is 250 copies / mL. A negative result does not preclude SARS-CoV-2 infection and should not be used as the sole basis for treatment or other patient management decisions.  A negative result may occur with improper specimen collection /  handling, submission of specimen other than nasopharyngeal swab, presence of viral mutation(s) within the areas targeted by this assay, and inadequate number of viral copies (<250 copies / mL). A negative result must be combined with clinical observations, patient history, and epidemiological information. Fact Sheet for Patients:   BoilerBrush.com.cy Fact Sheet for Healthcare Providers: https://pope.com/ This test is not yet approved or cleared  by the [place] FDA and has been authorized for detection and/or diagnosis of SARS-CoV-2 by FDA under an Emergency Use Authorization (EUA).  This EUA will remain in effect (meaning this test can be used) for the duration of the COVID-19 declaration under Section 564(b)(1) of the Act, 21 U.S.C. section 360bbb-3(b)(1), unless the authorization is terminated or revoked sooner. Performed at [facility], [address], [place], [place] [address]     Current Facility-Administered Medications  Medication Dose Route Frequency Provider Last Rate Last Admin  . benztropine (COGENTIN) tablet 0.25 mg  0.25  mg Oral Daily Roselind Messier, MD      . DULoxetine (CYMBALTA) DR capsule 20 mg  20 mg Oral Daily Roselind Messier, MD      . risperiDONE (RISPERDAL) tablet 0.25 mg  0.25 mg Oral QHS Roselind Messier, MD      . risperiDONE (RISPERDAL) tablet 2 mg  2 mg Oral QHS Roselind Messier, MD       Current Outpatient Medications  Medication Sig Dispense Refill  . D3 SUPER STRENGTH 50 MCG (2000 UT) CAPS Take 1 capsule by mouth daily.    . risperidone (RISPERDAL) 4 MG tablet Take 8 mg by mouth daily at 12 noon.      Musculoskeletal: Strength & Muscle Tone:  Normal at rest  Gait & Station: normal in general  Patient leans:  N/A  Psychiatric Specialty Exam: Physical Exam per  ER   Review of Systems  (8 systems normal when reviewed  In general   Blood pressure 120/64, pulse 89, temperature 98.3 F (36.8 C), temperature source Oral, resp. rate 17, height 5\' 9"  (1.753 m), weight 81.6 kg, SpO2 98 %.Body mass index is 26.58 kg/m.    Mental Status     Alert cooperative, oriented to person place and time not clouded or  Consciousness is not fluctuant  Rapport --fair, tries to cooperate and make eye contact  Concentration and attention ---he tends and answers questions   Mood depressed  Affect constricted  Anxious --- Thought process --at times slightly illogical  Thought content --unclear he is vague on hearing voices, seeing visual hallucinations  Is paranoid fearful as meds are wearing off Preoccupied with grief, depressive themes Speech --normal rate tone volume fluency  Judgement insight reliability fair to poor  Memory remote and recent intact through general questions Fund of knowledge and intelligence below average  Movements --no tics OCD traits or movement problems  Abstraction ---somewhat concrete ---                                                    Treatment Plan Summary:   Patient remains on IVC --pending admission due to worsening psychosis, med non  compliance, isolation and unresolved bereavement.  Unclear and unsafe margin warranting admission     Disposition:   Admitted to psych from observation -- pending observation   I have written regular psych meds with the intention of switching to  depot Risperdal or Invega before discharge or inpatient can do this as well.     Roselind Messier, MD 02/21/2020 2:50 PM

## 2020-02-21 NOTE — ED Provider Notes (Addendum)
Emergency Medicine Observation Re-evaluation Note  Clinton Hart is a 39 y.o. male, seen on rounds today.  Pt initially presented to the ED for complaints of Suicidal Currently, the patient is sleeping.  Physical Exam  BP 134/75 (BP Location: Left Arm)   Pulse (!) 105   Temp 98.6 F (37 C) (Oral)   Resp 18   Ht 1.753 m (5\' 9" )   Wt 81.6 kg   SpO2 97%   BMI 26.58 kg/m  Physical Exam  ED Course / MDM  EKG:    I have reviewed the labs performed to date as well as medications administered while in observation.  Recent changes in the last 24 hours include initial assessment and then return to the emergency department with complaints of suicidal ideation.  5:08 AM:  Update from behavioral medicine.  Plan is to reassess in the morning for possible placement. Plan  Current plan is for psychiatric evaluation and disposition recommendations. Patient is under full IVC at this time.      , MD 02/21/20 [phone]

## 2020-02-21 NOTE — BH Assessment (Signed)
Tele Assessment Note   Patient Name: Clinton Hart MRN: [id number] Referring Physician: Loleta Rose Location of Patient: [facility] Location of Provider: Behavioral Health TTS Department  Clinton Hart is an 39 y.o. male. Pt presents to [facility] voluntarily unaccompanied for passive intermittent suicidal thoughts past 2 weeks, especially earlier today. Pt states he was recently released from [facility] a few weeks ago and was there for about 6 months for schizophrenia. Pt denies current SI, HI, AVH and self harm behaviors, states he has had ongoing SI thoughts to take his own life last 2 weeks. Pt states no specific triggers for SI thoughts, states he is alone, has no family support and parents died in 01-16-15. Pt denies any past SI attempts or self harm. Pt denies any drug use past or present. Although pt denies AVH, he states he last experienced hallucinations a few months ago but states he does experience scattered thoughts, lack of emotion, paranoia, is hyper-religious, anxious and on edge at times. Pt endorsed symptoms of depression currently: Isolation, anxiety, feelings of hopelessness, worthlessness and insomnia. Pt states he is only getting about 4 hours of sleep daily instead of 8 hours he was getting. Pt reports poor appetite, states he is currently on restrictive diet due to religious reasons last 3 weeks and has lost weight, unknown how many pounds. Pt reports no history of trauma/abuse, no family history of trauma/abuse, substance use or SI. Pt reports decreased grooming last few weeks, states he showers every few days. Pt reports no criminal charges currently, no violence or access to weapons. Pt reports current provider through [facility], currently prescribed Risperdal, has been taking last 6 months, states he did miss a week of not taking medications recently. Pt was asked what services he needs, pt states he is unsure, per EDP it was reported earlier that he wanted to  return to [facility] due to him being familiar with structure and routine of facility. Pt was not able to provide any collateral information at this time, stated he is alone has no family or support.  Pt presented oriented x3, in scrubs and alert. Pt speech logical/coherent. Pt mood anxious but pleasant, affect congruent. Pt judgment partial, presented a bit scattered and confused at times. Pt did not present to be responding to internal stimuli or delusional content. Pt was cooperative and pleasant throughout assessment and could contract for safety at this time.  Diagnosis:F20.9 Schizophrenia  Past Medical History:  Past Medical History:  Diagnosis Date  . Schizophrenia ([facility])     No past surgical history on file.  Family History: No family history on file.  Social History:  reports that he has been smoking. He has never used smokeless tobacco. He reports that he does not drink alcohol or use drugs.  Additional Social History:  Alcohol / Drug Use Pain Medications: see MAR Prescriptions: see MAR Over the Counter: see MAR  CIWA: CIWA-Ar BP: 134/75 Pulse Rate: (!) 105 COWS:    Allergies:  Allergies  Allergen Reactions  . Shellfish Allergy Anaphylaxis    Home Medications: (Not in a hospital admission)   OB/GYN Status:  No LMP for male patient.  General Assessment Data Location of Assessment: [facility] ED TTS Assessment: In system Is this a Tele or Face-to-Face Assessment?: Tele Assessment Is this an Initial Assessment or a Re-assessment for this encounter?: Initial Assessment Patient Accompanied by:: N/A Language Other than English: No Living Arrangements: Other (Comment) What gender do you identify as?: Male Marital status: Single  Pregnancy Status: No Living Arrangements: Alone Can pt return to current living arrangement?: Yes Admission Status: Voluntary Is patient capable of signing voluntary admission?: Yes Referral Source: Self/Family/Friend Insurance type: Medicare      Crisis Care Plan Living Arrangements: Alone Legal Guardian: Other:(self) Name of Psychiatrist: None reported  Name of Therapist: None reported   Education Status Is patient currently in school?: No Is the patient employed, unemployed or receiving disability?: Receiving disability income  Risk to self with the past 6 months Suicidal Ideation: No-Not Currently/Within Last 6 Months Has patient been a risk to self within the past 6 months prior to admission? : No Suicidal Intent: No Has patient had any suicidal intent within the past 6 months prior to admission? : No Is patient at risk for suicide?: No Suicidal Plan?: No Has patient had any suicidal plan within the past 6 months prior to admission? : No Access to Means: No What has been your use of drugs/alcohol within the last 12 months?: none Previous Attempts/Gestures: No How many times?: 0(pt denies) Other Self Harm Risks: none reported Triggers for Past Attempts: None known Intentional Self Injurious Behavior: None Family Suicide History: Unknown Recent stressful life event(s): Other (Comment) Persecutory voices/beliefs?: No Depression: Yes Depression Symptoms: Feeling angry/irritable, Feeling worthless/self pity, Isolating, Insomnia, Despondent Substance abuse history and/or treatment for substance abuse?: No Suicide prevention information given to non-admitted patients: Not applicable  Risk to Others within the past 6 months Homicidal Ideation: No Does patient have any lifetime risk of violence toward others beyond the six months prior to admission? : No Thoughts of Harm to Others: No Current Homicidal Intent: No Current Homicidal Plan: No Access to Homicidal Means: No Identified Victim: none History of harm to others?: No Assessment of Violence: None Noted Violent Behavior Description: none Does patient have access to weapons?: No Criminal Charges Pending?: No Does patient have a court date: No Is patient on  probation?: No  Psychosis Hallucinations: None noted Delusions: None noted  Mental Status Report Appearance/Hygiene: In scrubs Eye Contact: Good Motor Activity: Unremarkable Speech: Logical/coherent Level of Consciousness: Alert Mood: Anxious, Pleasant Affect: Anxious Anxiety Level: Minimal Thought Processes: Coherent, Relevant Judgement: Partial Orientation: Person, Place, Time, Situation Obsessive Compulsive Thoughts/Behaviors: None  Cognitive Functioning Concentration: Fair Memory: Recent Intact Is patient IDD: No Insight: Fair Impulse Control: Fair Appetite: Poor Have you had any weight changes? : Loss Amount of the weight change? (lbs): (unknown) Sleep: Decreased Total Hours of Sleep: 4 Vegetative Symptoms: Decreased grooming  ADLScreening [facility] Assessment Services) Patient's cognitive ability adequate to safely complete daily activities?: Yes Patient able to express need for assistance with ADLs?: Yes Independently performs ADLs?: Yes (appropriate for developmental age)  Prior Inpatient Therapy Prior Inpatient Therapy: Yes Prior Therapy Dates: 2019-2020 Prior Therapy Facilty/Provider(s): [facility]  Reason for Treatment: MH  Prior Outpatient Therapy Prior Outpatient Therapy: No Does patient have an ACCT team?: Yes Does patient have Intensive In-House Services?  : No Does patient have [facility] services? : No Does patient have P4CC services?: No  ADL Screening (condition at time of admission) Patient's cognitive ability adequate to safely complete daily activities?: Yes Is the patient deaf or have difficulty hearing?: No Does the patient have difficulty seeing, even when wearing glasses/contacts?: No Does the patient have difficulty concentrating, remembering, or making decisions?: No Patient able to express need for assistance with ADLs?: Yes Does the patient have difficulty dressing or bathing?: No Independently performs ADLs?: Yes (appropriate for  developmental age) Does the patient have difficulty  walking or climbing stairs?: No Weakness of Legs: None Weakness of Arms/Hands: None  Home Assistive Devices/Equipment Home Assistive Devices/Equipment: None          Advance Directives (For Healthcare) Does Patient Have a Medical Advance Directive?: No Would patient like information on creating a medical advance directive?: No - Patient declined          Disposition: Adaku, Anike, FNP recommends pt for overnight observation, reassess in the morning. TTS confirm with provider. Disposition Initial Assessment Completed for this Encounter: Yes  This service was provided via telemedicine using a 2-way, interactive audio and video technology.  Names of all persons participating in this telemedicine service and their role in this encounter. Name: Gershon Cull Role: Patient  Name: Lacey Jensen Role: TTS  Name:  Role:   Name:  Role:     Natasha Mead 02/21/2020 4:22 AM

## 2020-02-21 NOTE — ED Notes (Signed)
IVC/Consult Completed/ Pending Inpt Admit 

## 2020-02-21 NOTE — ED Notes (Signed)
TTS monitor set up in day room for pt. Pt easily awoken. ambulated with a steady gait. Calm and cooperative at this time with pleasant demeanor.

## 2020-02-21 NOTE — ED Notes (Signed)
TTS evaluation complete. Pt ambulated back to room. Tolerated well.

## 2020-02-21 NOTE — BH Assessment (Addendum)
TTS & psych reassessment completed. Pt presented calm, pleasant and oriented x 3. Pt continues to endorse SI, paranoia around living alone and depression symptoms (grief, loss of interest in activities, low mood). Pt reports hx of schizophrenia, noncompliance with medications and denies any current SA. Pt denies any AH/VH but is unable to contract for safety.   Per Dr. Danella Penton pt is recommended for inpatient treatment

## 2020-02-22 ENCOUNTER — Encounter: Payer: Self-pay | Admitting: Psychiatry

## 2020-02-22 ENCOUNTER — Other Ambulatory Visit: Payer: Self-pay

## 2020-02-22 ENCOUNTER — Inpatient Hospital Stay
Admission: AD | Admit: 2020-02-22 | Discharge: 2020-02-23 | DRG: 885 | Disposition: A | Discharge status: Home / Self Care | Payer: Medicare Other | Source: Intra-hospital | Attending: Psychiatry | Admitting: Psychiatry

## 2020-02-22 DIAGNOSIS — F259 Schizoaffective disorder, unspecified: Secondary | ICD-10-CM | POA: Diagnosis present

## 2020-02-22 DIAGNOSIS — Z818 Family history of other mental and behavioral disorders: Secondary | ICD-10-CM | POA: Diagnosis not present

## 2020-02-22 DIAGNOSIS — F329 Major depressive disorder, single episode, unspecified: Secondary | ICD-10-CM | POA: Diagnosis present

## 2020-02-22 DIAGNOSIS — Z20822 Contact with and (suspected) exposure to covid-19: Secondary | ICD-10-CM | POA: Diagnosis present

## 2020-02-22 DIAGNOSIS — G2571 Drug induced akathisia: Secondary | ICD-10-CM | POA: Diagnosis not present

## 2020-02-22 DIAGNOSIS — Z91128 Patient's intentional underdosing of medication regimen for other reason: Secondary | ICD-10-CM

## 2020-02-22 DIAGNOSIS — F251 Schizoaffective disorder, depressive type: Secondary | ICD-10-CM

## 2020-02-22 DIAGNOSIS — Z91013 Allergy to seafood: Secondary | ICD-10-CM

## 2020-02-22 DIAGNOSIS — F17210 Nicotine dependence, cigarettes, uncomplicated: Secondary | ICD-10-CM | POA: Diagnosis present

## 2020-02-22 DIAGNOSIS — Z79899 Other long term (current) drug therapy: Secondary | ICD-10-CM | POA: Diagnosis not present

## 2020-02-22 DIAGNOSIS — R45851 Suicidal ideations: Secondary | ICD-10-CM | POA: Diagnosis present

## 2020-02-22 DIAGNOSIS — T43596A Underdosing of other antipsychotics and neuroleptics, initial encounter: Secondary | ICD-10-CM | POA: Diagnosis present

## 2020-02-22 LAB — LIPID PANEL
Cholesterol: 180 mg/dL (ref 0–200)
HDL: 44 mg/dL (ref >40)
LDL Cholesterol: 99 mg/dL (ref 0–99)
Total CHOL/HDL Ratio: 4.1 RATIO
Triglycerides: 185 mg/dL — ABNORMAL HIGH (ref <150)
VLDL: 37 mg/dL (ref 0–40)

## 2020-02-22 MED ORDER — DULOXETINE HCL 20 MG PO CPEP
20.0000 mg | ORAL_CAPSULE | Freq: Every day | ORAL | Status: DC
  Filled 2020-02-22: qty 1

## 2020-02-22 MED ORDER — MAGNESIUM HYDROXIDE 400 MG/5ML PO SUSP: 30.0000 mL | Freq: Every day | ORAL | Status: DC | PRN

## 2020-02-22 MED ORDER — RISPERIDONE 1 MG PO TABS: 2.0000 mg | ORAL_TABLET | Freq: Every day | ORAL | Status: DC

## 2020-02-22 MED ORDER — RISPERIDONE 1 MG PO TABS
4.0000 mg | ORAL_TABLET | Freq: Every day | ORAL | Status: DC
  Administered 2020-02-22 – 2020-02-23 (×2): 4 mg via ORAL
  Filled 2020-02-22 (×2): qty 4

## 2020-02-22 MED ORDER — ACETAMINOPHEN 325 MG PO TABS: 650.0000 mg | ORAL_TABLET | Freq: Four times a day (QID) | ORAL | Status: DC | PRN

## 2020-02-22 MED ORDER — BENZTROPINE MESYLATE 0.5 MG PO TABS
0.2500 mg | ORAL_TABLET | Freq: Every day | ORAL | Status: DC
  Administered 2020-02-23: 0.25 mg via ORAL
  Filled 2020-02-22: qty 1

## 2020-02-22 MED ORDER — RISPERIDONE 0.25 MG PO TABS: 0.2500 mg | ORAL_TABLET | Freq: Every day | ORAL | Status: DC

## 2020-02-22 MED ORDER — TRAZODONE HCL 100 MG PO TABS: 100.0000 mg | ORAL_TABLET | Freq: Every evening | ORAL | Status: DC | PRN

## 2020-02-22 MED ORDER — ALUM & MAG HYDROXIDE-SIMETH 200-200-20 MG/5ML PO SUSP: 30.0000 mL | ORAL | Status: DC | PRN

## 2020-02-22 NOTE — BH Assessment (Signed)
Patient can come down between 3-5pm  Patient is to be admitted to [facility] BM[facility] by Dr. Toni Amend.  Attending Physician will be. Dr. Toni Amend.   Patient has been assigned to room 307, by [facility] Charge Nurse Greig Castilla, RN.   Intake Paper Work has been signed and placed on patient chart.  ER staff is aware of the admission: 1. Misty Stanley, ER Secretary  2. [place], ER MD  3. Amy, Patient's Nurse  4. THO Patient Access.

## 2020-02-22 NOTE — Progress Notes (Addendum)
Admission Note:   Pt is alert and oriented to person, place, time and situation. Pt is calm, cooperative, pleasant, polite, denies current suicidal and homicidal ideation, denies hallucinations, denies feelings of anxiety, reports feelings of depression. Pt reports that he has a history of schizophrenia, reports he is on disability for his mental illness, lives alone, recently dropped out of school due to feeling overwhelmed and depressed, stopped talking his psychiatric meds intermittently. Pt reports he is an ACT team patient. Also reports that he plans to start volunteering at [employer] soon. Pt reports he has become increasingly depressed and started to have suicidal ideation yesterday and drove himself to the ED for evaluation, but today those feelings are no longer there, and he doesn't want to harm himself, feels hopeful to get well due to being in the hospital and starting back on his meds. Pt has good insight into his illness. Pt has a blunted affect. Pt's skin assessment/contraband search completed during admission with another nurse as witness, and it is unremarkable. Pt given orientation to unit and unit rules and programming and pt's room, also given hygiene supplies. Pt makes good eye contact and states, "Thank You." Will continue to monitor pt per Q15 minute face checks and monitor for safety and progress.

## 2020-02-22 NOTE — H&P (Signed)
Psychiatric Admission Assessment Adult  Patient Identification: Clinton Hart MRN:  [id number] Date of Evaluation:  02/22/2020 Chief Complaint:  Schizoaffective disorder (HCC) [F25.9] Principal Diagnosis: Schizoaffective disorder (HCC) Diagnosis:  Principal Problem:   Schizoaffective disorder (HCC)  History of Present Illness: Patient seen chart reviewed.  39 year old man with a history of schizophrenia came to the emergency room reporting suicidal thoughts and depression.  He tells me that he stopped taking his Risperdal about 2 weeks ago and then at the same time got depressed and developed paranoia and suicidal thoughts.  Not sleeping well.  Came to the emergency room wanting to get restarted on his medicine.  He has no particular reason why he stopped taking his medicine.  Patient denies alcohol or drug abuse.  He does have an act team he follows up with regularly.  Patient has been restarted on his medicine in the emergency room and denies any auditory hallucinations paranoia suicidal or homicidal ideation now. Associated Signs/Symptoms: Depression Symptoms:  depressed mood, suicidal thoughts without plan, (Hypo) Manic Symptoms:  Impulsivity, Anxiety Symptoms:  Excessive Worry, Psychotic Symptoms:  Paranoia, PTSD Symptoms: Negative Total Time spent with patient: 1 hour  Past Psychiatric History: Past history of schizophrenia.  Last hospitalization here 2016.  Seems to be stable on Risperdal from his act team  Is the patient at risk to self? No.  Has the patient been a risk to self in the past 6 months? No.  Has the patient been a risk to self within the distant past? No.  Is the patient a risk to others? No.  Has the patient been a risk to others in the past 6 months? No.  Has the patient been a risk to others within the distant past? No.   Prior Inpatient Therapy:   Prior Outpatient Therapy:    Alcohol Screening:   Substance Abuse History in the last 12 months:   No. Consequences of Substance Abuse: Negative Previous Psychotropic Medications: Yes  Psychological Evaluations: Yes  Past Medical History:  Past Medical History:  Diagnosis Date  . Schizophrenia (HCC)    History reviewed. No pertinent surgical history. Family History: History reviewed. No pertinent family history. Family Psychiatric  History: 1 person in the family with schizophrenia Tobacco Screening:   Social History:  Social History   Substance and Sexual Activity  Alcohol Use Never     Social History   Substance and Sexual Activity  Drug Use Never    Additional Social History:                           Allergies:   Allergies  Allergen Reactions  . Shellfish Allergy Anaphylaxis   Lab Results:  Results for orders placed or performed during the hospital encounter of 02/20/20 (from the past 48 hour(s))  SARS Coronavirus 2 by RT PCR (hospital order, performed in [facility] hospital lab) Nasopharyngeal Nasopharyngeal Swab     Status: None   Collection Time: 02/20/20 11:23 PM   Specimen: Nasopharyngeal Swab  Result Value Ref Range   SARS Coronavirus 2 NEGATIVE NEGATIVE    Comment: (NOTE) SARS-CoV-2 target nucleic acids are NOT DETECTED. The SARS-CoV-2 RNA is generally detectable in upper and lower respiratory specimens during the acute phase of infection. The lowest concentration of SARS-CoV-2 viral copies this assay can detect is 250 copies / mL. A negative result does not preclude SARS-CoV-2 infection and should not be used as the sole basis for treatment or other  patient management decisions.  A negative result may occur with improper specimen collection / handling, submission of specimen other than nasopharyngeal swab, presence of viral mutation(s) within the areas targeted by this assay, and inadequate number of viral copies (<250 copies / mL). A negative result must be combined with clinical observations, patient history, and epidemiological  information. Fact Sheet for Patients:   BoilerBrush.com.cy Fact Sheet for Healthcare Providers: https://pope.com/ This test is not yet approved or cleared  by the [place] FDA and has been authorized for detection and/or diagnosis of SARS-CoV-2 by FDA under an Emergency Use Authorization (EUA).  This EUA will remain in effect (meaning this test can be used) for the duration of the COVID-19 declaration under Section 564(b)(1) of the Act, 21 U.S.C. section 360bbb-3(b)(1), unless the authorization is terminated or revoked sooner. Performed at [facility], [address] [place], [place] [address]     Blood Alcohol level:  Lab Results  Component Value Date   [facility] <10 02/19/2020    Metabolic Disorder Labs:  Lab Results  Component Value Date   HGBA1C 5.5 07/01/2014   No results found for: PROLACTIN No results found for: CHOL, TRIG, HDL, CHOLHDL, VLDL, LDLCALC  Current Medications: Current Facility-Administered Medications  Medication Dose Route Frequency Provider Last Rate Last Admin  . acetaminophen (TYLENOL) tablet 650 mg  650 mg Oral Q6H PRN Lorece Keach T, MD      . alum & mag hydroxide-simeth (MAALOX/MYLANTA) 200-200-20 MG/5ML suspension 30 mL  30 mL Oral Q4H PRN Souleymane Saiki, Jackquline Denmark, MD      . Melene Muller ON 02/23/2020] benztropine (COGENTIN) tablet 0.25 mg  0.25 mg Oral Daily Lenis Nettleton T, MD      . magnesium hydroxide (MILK OF MAGNESIA) suspension 30 mL  30 mL Oral Daily PRN Alba Perillo T, MD      . risperiDONE (RISPERDAL) tablet 4 mg  4 mg Oral Daily Mikael Debell T, MD      . traZODone (DESYREL) tablet 100 mg  100 mg Oral QHS PRN Talibah Colasurdo, Jackquline Denmark, MD       PTA Medications: Medications Prior to Admission  Medication Sig Dispense Refill Last Dose  . D3 SUPER STRENGTH 50 MCG (2000 UT) CAPS Take 1 capsule by mouth daily.     . risperidone (RISPERDAL) 4 MG tablet Take 8 mg by mouth daily at 12 noon.        Musculoskeletal: Strength & Muscle Tone: within normal limits Gait & Station: normal Patient leans: N/A  Psychiatric Specialty Exam: Physical Exam  Nursing note and vitals reviewed. Constitutional: He appears well-developed and well-nourished.  HENT:  Head: Normocephalic and atraumatic.  Eyes: Pupils are equal, round, and reactive to light. Conjunctivae are normal.  Cardiovascular: Regular rhythm and normal heart sounds.  Respiratory: Effort normal. No respiratory distress.  GI: Soft.  Musculoskeletal:        General: Normal range of motion.     Cervical back: Normal range of motion.  Neurological: He is alert.  Skin: Skin is warm and dry.  Psychiatric: He has a normal mood and affect. His behavior is normal. Judgment and thought content normal.    Review of Systems  Constitutional: Negative.   HENT: Negative.   Eyes: Negative.   Respiratory: Negative.   Cardiovascular: Negative.   Gastrointestinal: Negative.   Musculoskeletal: Negative.   Skin: Negative.   Neurological: Negative.   Psychiatric/Behavioral: Negative.     Blood pressure 140/82, pulse 80, temperature 98.8 F (37.1 C), temperature  source Oral, resp. rate 16, height 5\' 9"  (1.753 m), weight 88 kg, SpO2 98 %.Body mass index is 28.65 kg/m.  General Appearance: Casual  Eye Contact:  Good  Speech:  Clear and Coherent  Volume:  Normal  Mood:  Euthymic  Affect:  Congruent  Thought Process:  Goal Directed  Orientation:  Full (Time, Place, and Person)  Thought Content:  Logical  Suicidal Thoughts:  No  Homicidal Thoughts:  No  Memory:  Immediate;   Fair Recent;   Fair Remote;   Fair  Judgement:  Fair  Insight:  Fair  Psychomotor Activity:  Increased  Concentration:  Concentration: Fair  Recall:  [employer] of Knowledge:  Fair  Language:  Fair  Akathisia:  No  Handed:  Right  AIMS (if indicated):     Assets:  Desire for Improvement  ADL's:  Intact  Cognition:  WNL  Sleep:       Treatment Plan  Summary: Plan Risperdal back towards his daily dose of 4 to 8 mg a day.  No indication for other medicines at this point.  Supportive therapy and encouragement.  Engage in individual and group therapy.  15-minute checks in place.  Possible discharge 1 to 2 days  Observation Level/Precautions:  15 minute checks  Laboratory:  Chemistry Profile  Psychotherapy:    Medications:    Consultations:    Discharge Concerns:    Estimated LOS:  Other:     Physician Treatment Plan for Primary Diagnosis: Schizoaffective disorder ([place]) Long Term Goal(s): Improvement in symptoms so as ready for discharge  Short Term Goals: Ability to verbalize feelings will improve  Physician Treatment Plan for Secondary Diagnosis: Principal Problem:   Schizoaffective disorder ([place])  Long Term Goal(s): Improvement in symptoms so as ready for discharge  Short Term Goals: Compliance with prescribed medications will improve  I certify that inpatient services furnished can reasonably be expected to improve the patient's condition.    Alethia Berthold, MD 6/3/20215:21 PM

## 2020-02-22 NOTE — ED Notes (Signed)
Pt discharged under IVC to BMU.  Belongings sent with patient. Pt calm and cooperative.

## 2020-02-22 NOTE — ED Notes (Signed)
Meal tray provided.

## 2020-02-22 NOTE — BHH Suicide Risk Assessment (Signed)
[facility] Admission Suicide Risk Assessment   Nursing information obtained from:  Patient Demographic factors:  Living alone, Unemployed, Male Current Mental Status:  NA Loss Factors:  NA Historical Factors:  NA Risk Reduction Factors:  Positive coping skills or problem solving skills  Total Time spent with patient: 1 hour Principal Problem: Schizoaffective disorder ([place]) Diagnosis:  Principal Problem:   Schizoaffective disorder ([place])  Subjective Data: Patient seen chart reviewed.  This is a 39 year old man with schizophrenia who came to the emergency room several days ago reporting suicidal ideation.  On interview today the patient denies suicidal thoughts intent or plan.  Says he had been off his medicine for about 2 weeks and during that time he became depressed.  Started having suicidal thoughts.  Felt paranoid at home.  Was not sleeping well.  No specific reason that he gives why he stopped his medicine.  Not abusing alcohol or drugs.  Patient has been restarted on his medicine since being in the ER  Continued Clinical Symptoms:    The "Alcohol Use Disorders Identification Test", Guidelines for Use in Primary Care, Second Edition.  World Pharmacologist [facility]). Score between 0-7:  no or low risk or alcohol related problems. Score between 8-15:  moderate risk of alcohol related problems. Score between 16-19:  high risk of alcohol related problems. Score 20 or above:  warrants further diagnostic evaluation for alcohol dependence and treatment.   CLINICAL FACTORS:   Schizophrenia:   Depressive state   Musculoskeletal: Strength & Muscle Tone: within normal limits Gait & Station: normal Patient leans: N/A  Psychiatric Specialty Exam: Physical Exam  Nursing note and vitals reviewed. Constitutional: He appears well-developed and well-nourished.  HENT:  Head: Normocephalic and atraumatic.  Eyes: Pupils are equal, round, and reactive to light. Conjunctivae are normal.   Cardiovascular: Regular rhythm and normal heart sounds.  Respiratory: Effort normal. No respiratory distress.  GI: Soft.  Musculoskeletal:        General: Normal range of motion.     Cervical back: Normal range of motion.  Neurological: He is alert.  Skin: Skin is warm and dry.  Psychiatric: His affect is blunt. His speech is delayed. He is slowed. Cognition and memory are impaired. He expresses impulsivity. He expresses no homicidal and no suicidal ideation.    Review of Systems  Constitutional: Negative.   HENT: Negative.   Eyes: Negative.   Respiratory: Negative.   Cardiovascular: Negative.   Gastrointestinal: Negative.   Musculoskeletal: Negative.   Skin: Negative.   Neurological: Negative.   Psychiatric/Behavioral: Negative.     Blood pressure 140/82, pulse 80, temperature 98.8 F (37.1 C), temperature source Oral, resp. rate 16, height 5\' 9"  (1.753 m), weight 88 kg, SpO2 98 %.Body mass index is 28.65 kg/m.  General Appearance: Casual  Eye Contact:  Good  Speech:  Clear and Coherent  Volume:  Normal  Mood:  Euthymic  Affect:  Congruent  Thought Process:  Goal Directed  Orientation:  Full (Time, Place, and Person)  Thought Content:  Logical  Suicidal Thoughts:  No  Homicidal Thoughts:  No  Memory:  Immediate;   Fair Recent;   Fair Remote;   Fair  Judgement:  Fair  Insight:  Fair  Psychomotor Activity:  Normal  Concentration:  Concentration: Fair  Recall:  [employer] of Knowledge:  Fair  Language:  Fair  Akathisia:  No  Handed:  Right  AIMS (if indicated):     Assets:  Desire for [place]  ADL's:  Intact  Cognition:  WNL  Sleep:         COGNITIVE FEATURES THAT CONTRIBUTE TO RISK:  Loss of executive function    SUICIDE RISK:   Minimal: No identifiable suicidal ideation.  Patients presenting with no risk factors but with morbid ruminations; may be classified as minimal risk based on the severity of the depressive  symptoms  PLAN OF CARE: Continue antipsychotic medicine.  15-minute checks.  Individual and group therapy.  Discharge planning back to his act team  I certify that inpatient services furnished can reasonably be expected to improve the patient's condition.   Mordecai Rasmussen, MD 02/22/2020, 5:18 PM

## 2020-02-22 NOTE — BH Assessment (Signed)
Referral information for Psychiatric Hospitalization faxed to;    [facility] [phone]- [phone]),    [address] [phone]),    [facility] [phone] -or- [phone]),    [facility] [phone])   [facility] [phone]),    [facility] [phone]).   [facility] [phone])   [facility] [phone])

## 2020-02-22 NOTE — BH Assessment (Addendum)
Referral check:   [facility] [phone]- [phone]), Sarah reports no bed availability   [facility] [phone]), Tresa Endo reports under review    [facility] [phone] -or- [phone]), Jonelle Sidle asked to re-send [phone]) task completed 6/3 10am    [facility] [phone]) No answer    [facility] [phone]), Susie reports no male unit    [facility] [phone]), Left a  voicemail     [facility] [phone]) Bjorn Loser reports to be unable to reach anyone in intake and agreed to follow up later today   [facility] [phone]) [place] reports denied do not meet criteria

## 2020-02-22 NOTE — Progress Notes (Signed)
Pt has been alert and oriented to person, place, time and situation. Pt is calm, cooperative, makes good eye contact, is hypoverbal, does not interact much with peers, is pleasant and polite when staff initiates conversation for assessments with pt, denies suicidal and homicidal ideation, denies hallucinations, reports feeling depressed but "getting better." Pt denies anxiety, but is noted to be slowly pacing the hallways often. Pt is medication complaint. No distress noted, none reported. Will continue to monitor pt per Q15 minute face checks and monitor for safety and progress.

## 2020-02-23 MED ORDER — TRAZODONE HCL 100 MG PO TABS: 100.0000 mg | ORAL_TABLET | Freq: Every evening | ORAL | 1 refills | Status: DC | PRN

## 2020-02-23 MED ORDER — D3 SUPER STRENGTH 50 MCG (2000 UT) PO CAPS: 1.0000 | ORAL_CAPSULE | Freq: Every day | ORAL | 1 refills | Status: DC

## 2020-02-23 MED ORDER — BENZTROPINE MESYLATE 0.5 MG PO TABS: 0.2500 mg | ORAL_TABLET | Freq: Every day | ORAL | 1 refills | Status: DC

## 2020-02-23 MED ORDER — RISPERIDONE 4 MG PO TABS: 4.0000 mg | ORAL_TABLET | Freq: Every day | ORAL | 1 refills | Status: DC

## 2020-02-23 NOTE — Progress Notes (Signed)
Patient ID: Clinton Hart, male   DOB: 1981-07-22, 38 y.o.   MRN: [id number]   Discharge Note:  Patient denies SI/HI/AVH at this time. Discharge instructions, AVS, prescriptions, and transition record gone over with patient. Patient agrees to comply with medication management, he declined follow-up visit, and outpatient therapy. Patient belongings returned to patient. Patient questions and concerns addressed and answered. Patient ambulatory off unit. Patient discharged to parking lot, to his vehicle.

## 2020-02-23 NOTE — Progress Notes (Signed)
Recreation Therapy Notes  Date: 02/23/2020  Time: 9:30 am  Location: Craft room  Behavioral response: Appropriate  Intervention Topic: Emotions   Discussion/Intervention:  Group content on today was focused on emotions. The group identified what emotions are and why it is important to have emotions. Patients expressed some positive and negative emotions. Individuals gave some past experiences on how they normally dealt with emotions in the past. The group described some positive ways to deal with emotions in the future. Patients participated in the intervention "Name the Megan Salon" where individuals were given a chance to experience different emotions.  Clinical Observations/Feedback:  Patient came to group and defined emotions as how you feel. He stated that emotions come from situations. Individual was social with peers and staff while participating on the intervention.  Kaedyn Belardo LRT/CTRS         Tiari Andringa 02/23/2020 2:20 PM

## 2020-02-23 NOTE — Progress Notes (Signed)
D- Patient alert and oriented. Patient presents in a pleasant mood on assessment stating that he slept "good" last night and had no complaints to voice to this Clinical research associate. Patient denies SI, HI, AVH, and pain at this time. Patient also denies any signs/symptoms of depression/anxiety reporting "I feel pretty good today". Patient's goal for today is "my attitude", in which he will "go over the plan with the doctor", in order to achieve his goal.  A- Scheduled medications administered to patient, per MD orders. Support and encouragement provided.  Routine safety checks conducted every 15 minutes.  Patient informed to notify staff with problems or concerns.  R- No adverse drug reactions noted. Patient contracts for safety at this time. Patient compliant with medications and treatment plan. Patient receptive, calm, and cooperative. Patient interacts well with others on the unit.  Patient remains safe at this time.

## 2020-02-23 NOTE — Progress Notes (Signed)
MHT engaged in nightly group greeting and introducing self to patient. MHT went over the rules for the unit, provided snack, and went over the remaining routine for the night. MHT interacted with patient asking what a good coping skill is that is used daily and a good goal patient is working on meeting. Patient responded that his coping skill is reading. Patient stated his goal is to get a job volunteering and helping others.

## 2020-02-23 NOTE — BHH Counselor (Signed)
Adult Comprehensive Assessment  Patient ID: Clinton Hart, male   DOB: [birth year], 39 y.o.   MRN: [id number]  Information Source:    Current Stressors:     Living/Environment/Situation:  Living Arrangements: Alone  Family History:     Childhood History:     Education:     Employment/Work Situation:      Surveyor, quantity Resources:      Alcohol/Substance Abuse:      Social Support System:      Leisure/Recreation:      Strengths/Needs:      Discharge Plan:      Summary/Recommendations:   Emergency planning/management officer and Recommendations (to be completed by the evaluator): Patient is a 39 year old male from [place] [facility]).  He presents to the hospital following reports of suicidal ideation and missed medications.  He has a primary diagnosis of Schizoaffective Disorder.  Recommendations include: crisis stabilization, therapeutic milieu, encourage group attendance and participation, medication management for detox/mood stabilization and development of comprehensive mental.  Harden Mo. 02/23/2020

## 2020-02-23 NOTE — BHH Counselor (Signed)
CSW met with the patient and discussed aftercare. Patient declined for CSW to contact his ACTT team.   , MSW, LCSW 02/23/2020 9:14 AM  

## 2020-02-23 NOTE — Discharge Summary (Signed)
Physician Discharge Summary Note  Patient:  Clinton Hart is an 39 y.o., male MRN:  [id number] DOB:  21-Oct-1980 Patient phone:  [phone] (home)  Patient address:   [place] [address],  Total Time spent with patient: 30 minutes  Date of Admission:  02/22/2020 Date of Discharge: February 23, 2020  Reason for Admission: Admitted to the hospital because of presentation to the emergency room voicing suicidal thoughts  Principal Problem: Schizoaffective disorder [facility]) Discharge Diagnoses: Principal Problem:   Schizoaffective disorder ([place])   Past Psychiatric History: Past history of chronic schizophrenia with occasional decompensation  Past Medical History:  Past Medical History:  Diagnosis Date   Schizophrenia ([place])    History reviewed. No pertinent surgical history. Family History: History reviewed. No pertinent family history. Family Psychiatric  History: None reported Social History:  Social History   Substance and Sexual Activity  Alcohol Use Never     Social History   Substance and Sexual Activity  Drug Use Never    Social History   Socioeconomic History   Marital status: Single    Spouse name: Not on file   Number of children: Not on file   Years of education: Not on file   Highest education level: Not on file  Occupational History   Not on file  Tobacco Use   Smoking status: Current Every Day Smoker   Smokeless tobacco: Never Used  Substance and Sexual Activity   Alcohol use: Never   Drug use: Never   Sexual activity: Not on file  Other Topics Concern   Not on file  Social History Narrative   Not on file   Social Determinants of Health   Financial Resource Strain:    Difficulty of Paying Living Expenses:   Food Insecurity:    Worried About Charity fundraiser in the Last Year:    Arboriculturist in the Last Year:   Transportation Needs:    Film/video editor (Medical):    Lack of Transportation  (Non-Medical):   Physical Activity:    Days of Exercise per Week:    Minutes of Exercise per Session:   Stress:    Feeling of Stress :   Social Connections:    Frequency of Communication with Friends and Family:    Frequency of Social Gatherings with Friends and Family:    Attends Religious Services:    Active Member of Clubs or Organizations:    Attends Archivist Meetings:    Marital Status:     Hospital Course: Patient was pleasant and cooperative on evaluation and showed no dangerous behavior on the unit.  Denied any suicidal or homicidal thought.  Was cooperative with restarting medicine.  Patient was put back on Risperdal at 4 mg a day.  It is possible that he was taking 8 mg previously but since he is having a little bit of akathisia now and seems to be lucid and calm I will just leave it at the 4 mg.  He will be discharged today and follow-up with his act team.  Physical Findings: AIMS:  , ,  ,  ,    CIWA:    COWS:     Musculoskeletal: Strength & Muscle Tone: within normal limits Gait & Station: normal Patient leans: N/A  Psychiatric Specialty Exam: Physical Exam  Nursing note and vitals reviewed. Constitutional: He appears well-developed and well-nourished.  HENT:  Head: Normocephalic and atraumatic.  Eyes: Pupils are equal, round, and  reactive to light. Conjunctivae are normal.  Cardiovascular: Normal heart sounds.  Respiratory: Effort normal.  GI: Soft.  Musculoskeletal:        General: Normal range of motion.     Cervical back: Normal range of motion.  Neurological: He is alert.  Skin: Skin is warm and dry.  Psychiatric: He has a normal mood and affect. His behavior is normal. Judgment and thought content normal.    Review of Systems  Constitutional: Negative.   HENT: Negative.   Eyes: Negative.   Respiratory: Negative.   Cardiovascular: Negative.   Gastrointestinal: Negative.   Musculoskeletal: Negative.   Skin: Negative.    Neurological: Negative.   Psychiatric/Behavioral: Negative.     Blood pressure 111/79, pulse 80, temperature 98.9 F (37.2 C), temperature source Oral, resp. rate 18, height 5\' 9"  (1.753 m), weight 88 kg, SpO2 97 %.Body mass index is 28.65 kg/m.  General Appearance: Casual  Eye Contact:  Good  Speech:  Clear and Coherent  Volume:  Normal  Mood:  Euthymic  Affect:  Congruent  Thought Process:  Goal Directed  Orientation:  Full (Time, Place, and Person)  Thought Content:  Logical  Suicidal Thoughts:  No  Homicidal Thoughts:  No  Memory:  Immediate;   Fair Recent;   Fair Remote;   Fair  Judgement:  Fair  Insight:  Fair  Psychomotor Activity:  Normal  Concentration:  Concentration: Fair  Recall:  Fair  Fund of Knowledge:  Fair  Language:  Fair  Akathisia:  No  Handed:  Right  AIMS (if indicated):     Assets:  Desire for Improvement Housing Physical Health  ADL's:  Intact  Cognition:  WNL  Sleep:  Number of Hours: 6        Has this patient used any form of tobacco in the last 30 days? (Cigarettes, Smokeless Tobacco, Cigars, and/or Pipes) Yes, No  Blood Alcohol level:  Lab Results  Component Value Date   ETH <10 02/19/2020    Metabolic Disorder Labs:  Lab Results  Component Value Date   HGBA1C 5.5 07/01/2014   No results found for: PROLACTIN Lab Results  Component Value Date   CHOL 180 02/22/2020   TRIG 185 (H) 02/22/2020   HDL 44 02/22/2020   CHOLHDL 4.1 02/22/2020   VLDL 37 02/22/2020   LDLCALC 99 02/22/2020    See Psychiatric Specialty Exam and Suicide Risk Assessment completed by Attending Physician prior to discharge.  Discharge destination:  Home  Is patient on multiple antipsychotic therapies at discharge:  No   Has Patient had three or more failed trials of antipsychotic monotherapy by history:  No  Recommended Plan for Multiple Antipsychotic Therapies: NA  Discharge Instructions    Diet - low sodium heart healthy   Complete by: As  directed    Increase activity slowly   Complete by: As directed      Allergies as of 02/23/2020      Reactions   Shellfish Allergy Anaphylaxis      Medication List    TAKE these medications     Indication  benztropine 0.5 MG tablet Commonly known as: COGENTIN Take 0.5 tablets (0.25 mg total) by mouth daily. Start taking on: February 24, 2020  Indication: Extrapyramidal Reaction caused by Medications   D3 Super Strength 50 MCG (2000 UT) Caps Generic drug: Cholecalciferol Take 1 capsule (2,000 Units total) by mouth daily. What changed: how much to take  Indication: Vitamin D Deficiency   risperidone 4 MG tablet  Commonly known as: RISPERDAL Take 1 tablet (4 mg total) by mouth daily. Start taking on: February 24, 2020 What changed:   how much to take  when to take this  Indication: Schizophrenia   traZODone 100 MG tablet Commonly known as: DESYREL Take 1 tablet (100 mg total) by mouth at bedtime as needed for sleep.  Indication: Trouble Sleeping      Follow-up Information    patient declined Follow up.   Why: Patient declined. Contact information: Patient declined          Follow-up recommendations:  Activity:  Activity as tolerated Diet:  Regular diet Other:  Follow-up with act team  Comments: Procedure prescriptions provided at discharge  Signed: Mordecai Rasmussen, MD 02/23/2020, 9:29 AM

## 2020-02-23 NOTE — [facility] Suicide Risk Assessment (Signed)
[facility] INPATIENT:  Family/Significant Other Suicide Prevention Education  Suicide Prevention Education:  Patient Refusal for Family/Significant Other Suicide Prevention Education: The patient Clinton Hart has refused to provide written consent for family/significant other to be provided Family/Significant Other Suicide Prevention Education during admission and/or prior to discharge.  Physician notified.  SPE completed with pt, as pt refused to consent to family contact. SPI pamphlet provided to pt and pt was encouraged to share information with support network, ask questions, and talk about any concerns relating to SPE. Pt denies access to guns/firearms and verbalized understanding of information provided. Mobile Crisis information also provided to pt.   Harden Mo 02/23/2020, 9:13 AM

## 2020-02-23 NOTE — [facility] Suicide Risk Assessment (Signed)
[facility] Discharge Suicide Risk Assessment   Principal Problem: Schizoaffective disorder [facility]) Discharge Diagnoses: Principal Problem:   Schizoaffective disorder ([facility])   Total Time spent with patient: 30 minutes  Musculoskeletal: Strength & Muscle Tone: within normal limits Gait & Station: normal Patient leans: N/A  Psychiatric Specialty Exam: Review of Systems  Constitutional: Negative.   HENT: Negative.   Eyes: Negative.   Respiratory: Negative.   Cardiovascular: Negative.   Gastrointestinal: Negative.   Musculoskeletal: Negative.   Skin: Negative.   Neurological: Negative.   Psychiatric/Behavioral: Negative.     Blood pressure 111/79, pulse 80, temperature 98.9 F (37.2 C), temperature source Oral, resp. rate 18, height 5\' 9"  (1.753 m), weight 88 kg, SpO2 97 %.Body mass index is 28.65 kg/m.  General Appearance: Casual  Eye Contact::  Good  Speech:  Normal Rate409  Volume:  Normal  Mood:  Euthymic  Affect:  Constricted  Thought Process:  Goal Directed  Orientation:  Full (Time, Place, and Person)  Thought Content:  Logical  Suicidal Thoughts:  No  Homicidal Thoughts:  No  Memory:  Immediate;   Fair Recent;   Fair Remote;   Fair  Judgement:  Fair  Insight:  Fair  Psychomotor Activity:  Normal  Concentration:  Fair  Recall:  [phone] of Knowledge:Fair  Language: Fair  Akathisia:  No  Handed:  Right  AIMS (if indicated):     Assets:  Desire for Improvement Housing Physical Health  Sleep:  Number of Hours: 6  Cognition: WNL  ADL's:  Intact   Mental Status Per Nursing Assessment::   On Admission:  NA  Demographic Factors:  Male and Living alone  Loss Factors: NA  Historical Factors: NA  Risk Reduction Factors:   Sense of responsibility to family, Positive social support, Positive therapeutic relationship and Positive coping skills or problem solving skills  Continued Clinical Symptoms:  Schizophrenia:   Paranoid or undifferentiated  type  Cognitive Features That Contribute To Risk:  None    Suicide Risk:  Minimal: No identifiable suicidal ideation.  Patients presenting with no risk factors but with morbid ruminations; may be classified as minimal risk based on the severity of the depressive symptoms  Follow-up Information    patient declined Follow up.   Why: Patient declined. Contact information: Patient declined          Plan Of Care/Follow-up recommendations:  Activity:  Activity as tolerated Diet:  Regular diet Other:  Follow-up with outpatient treatment  [phone], MD 02/23/2020, 9:16 AM

## 2020-02-23 NOTE — Progress Notes (Signed)
°  [facility] Adult Case Management Discharge Plan :  Will you be returning to the same living situation after discharge:  Yes,  pt reports he is returning home. At discharge, do you have transportation home?: Yes,  pt reports that his car is on campus. Do you have the ability to pay for your medications: Yes,  Medicare Part A and B.  Release of information consent forms completed and in the chart;  Patient's signature needed at discharge.  Patient to Follow up at: Follow-up Information    patient declined Follow up.   Why: Patient declined. Contact information: Patient declined          Next level of care provider has access to [facility] Link:no  Safety Planning and Suicide Prevention discussed: No.  Pt declined SPE contact.  SPE completed with the patient.      Has patient been referred to the Quitline?: Patient refused referral  Patient has been referred for addiction treatment: Pt. refused referral  Harden Mo, LCSW 02/23/2020, 9:12 AM

## 2020-02-24 ENCOUNTER — Emergency Department
Admission: EM | Admit: 2020-02-24 | Discharge: 2020-02-25 | Disposition: A | Discharge status: Home / Self Care | Payer: Medicare Other | Attending: Student | Admitting: Student

## 2020-02-24 ENCOUNTER — Other Ambulatory Visit: Payer: Self-pay

## 2020-02-24 DIAGNOSIS — R45851 Suicidal ideations: Secondary | ICD-10-CM | POA: Insufficient documentation

## 2020-02-24 DIAGNOSIS — F250 Schizoaffective disorder, bipolar type: Secondary | ICD-10-CM | POA: Insufficient documentation

## 2020-02-24 DIAGNOSIS — F17200 Nicotine dependence, unspecified, uncomplicated: Secondary | ICD-10-CM | POA: Diagnosis not present

## 2020-02-24 DIAGNOSIS — Z8659 Personal history of other mental and behavioral disorders: Secondary | ICD-10-CM | POA: Insufficient documentation

## 2020-02-24 DIAGNOSIS — F209 Schizophrenia, unspecified: Secondary | ICD-10-CM | POA: Diagnosis not present

## 2020-02-24 DIAGNOSIS — Z008 Encounter for other general examination: Secondary | ICD-10-CM

## 2020-02-24 DIAGNOSIS — Z046 Encounter for general psychiatric examination, requested by authority: Secondary | ICD-10-CM | POA: Diagnosis present

## 2020-02-24 LAB — URINE DRUG SCREEN, QUALITATIVE (ARMC ONLY)
Amphetamines, Ur Screen: NOT DETECTED
Barbiturates, Ur Screen: NOT DETECTED
Benzodiazepine, Ur Scrn: NOT DETECTED
Cannabinoid 50 Ng, Ur ~~LOC~~: NOT DETECTED
Cocaine Metabolite,Ur ~~LOC~~: NOT DETECTED
MDMA (Ecstasy)Ur Screen: NOT DETECTED
Methadone Scn, Ur: NOT DETECTED
Opiate, Ur Screen: NOT DETECTED
Phencyclidine (PCP) Ur S: NOT DETECTED
Tricyclic, Ur Screen: NOT DETECTED

## 2020-02-24 NOTE — ED Notes (Signed)
TTS at bedside. 

## 2020-02-24 NOTE — ED Notes (Addendum)
Pt dressed out by tech Dorian and this RN 1 brown ball cap  1 set of keys 2 cell phones 1 ID 1 white shirt 1 pair of black shorts 1 pair of red slip on Nike shoes  Items placed in labeled patient belongings bag

## 2020-02-24 NOTE — BH Assessment (Addendum)
Assessment Note  Clinton Hart is an 39 y.o. male who presents to the ER due to having thoughts of death but have no plans, gestures or intentions of ending his life. Patient further reports, his current stressor is being afraid of living alone. He reports of "not feeling right." When asked to explain, patient was having difficulty explaining what he meant. Thus, he shared he has lived alone for approximately a year. His parents passed approximately two years ago and this is the first time he has had to live alone. He's requesting to go to a group home.  He currently receives outpatient treatment with [facility] [place]. He's been with their team for approximately eight months.  During the interview, patient is calm, cooperative and pleasant. He was able to provide appropriate answers to the questions. He denies SI/HI and AV/H. He also denies the use of mind-altering substances.  Diagnosis: Schizophrenia  Past Medical History:  Past Medical History:  Diagnosis Date  . Schizophrenia ([place])     History reviewed. No pertinent surgical history.  Family History: History reviewed. No pertinent family history.  Social History:  reports that he has been smoking. He has never used smokeless tobacco. He reports that he does not drink alcohol or use drugs.  Additional Social History:  Alcohol / Drug Use Pain Medications: See PTA Prescriptions: See PTA Over the Counter: See PTA History of alcohol / drug use?: No history of alcohol / drug abuse Longest period of sobriety (when/how long): n/a  CIWA: CIWA-Ar BP: 128/71 Pulse Rate: 92 COWS:    Allergies:  Allergies  Allergen Reactions  . Shellfish Allergy Anaphylaxis    Home Medications: (Not in a hospital admission)   OB/GYN Status:  No LMP for male patient.  General Assessment Data Location of Assessment: [facility] ED TTS Assessment: In system Is this a Tele or Face-to-Face Assessment?: Face-to-Face Is this an  Initial Assessment or a Re-assessment for this encounter?: Initial Assessment Patient Accompanied by:: N/A Language Other than English: No Living Arrangements: Other (Comment)(Private Home) What gender do you identify as?: Male Marital status: Single Pregnancy Status: No Living Arrangements: Alone Can pt return to current living arrangement?: Yes Admission Status: Voluntary Is patient capable of signing voluntary admission?: Yes Referral Source: Self/Family/Friend Insurance type: Medicare A&B  Medical Screening Exam ([place]) Medical Exam completed: Yes  Crisis Care Plan Living Arrangements: Alone Legal Guardian: Other:(Self) Name of Psychiatrist: [facility] [place] Name of Therapist: [employer] [facility] [place]  Education Status Is patient currently in school?: No Is the patient employed, unemployed or receiving disability?: Unemployed, Receiving disability income  Risk to self with the past 6 months Suicidal Ideation: No Has patient been a risk to self within the past 6 months prior to admission? : No Suicidal Intent: No Has patient had any suicidal intent within the past 6 months prior to admission? : No Is patient at risk for suicide?: No Suicidal Plan?: No Has patient had any suicidal plan within the past 6 months prior to admission? : No Access to Means: No What has been your use of drugs/alcohol within the last 12 months?: Reports of none Previous Attempts/Gestures: No Other Self Harm Risks: Reports of none Triggers for Past Attempts: None known Intentional Self Injurious Behavior: None Family Suicide History: No Recent stressful life event(s): Other (Comment)(Don't want to live alone) Persecutory voices/beliefs?: No Depression: Yes Depression Symptoms: Isolating Substance abuse history and/or treatment for substance abuse?: No  Suicide prevention information given to non-admitted patients: Not applicable  Risk to Others  within the past 6 months Homicidal Ideation: No Does patient have any lifetime risk of violence toward others beyond the six months prior to admission? : No Thoughts of Harm to Others: No Current Homicidal Intent: No Current Homicidal Plan: No Access to Homicidal Means: No Identified Victim: Reports of none History of harm to others?: No Assessment of Violence: None Noted Violent Behavior Description: Reports of none Does patient have access to weapons?: No Does patient have a court date: No Is patient on probation?: No  Psychosis Hallucinations: None noted Delusions: None noted  Mental Status Report Appearance/Hygiene: Unremarkable, In scrubs Eye Contact: Fair Motor Activity: Freedom of movement, Unremarkable Speech: Logical/coherent, Unremarkable Level of Consciousness: Alert Mood: Anxious, Sad, Pleasant Affect: Appropriate to circumstance Anxiety Level: Minimal Thought Processes: Coherent, Relevant Judgement: Unimpaired Orientation: Person, Place, Time, Situation, Appropriate for developmental age Obsessive Compulsive Thoughts/Behaviors: Minimal  Cognitive Functioning Concentration: Normal Memory: Recent Intact, Remote Intact Is patient IDD: No Insight: Fair Impulse Control: Good Appetite: Fair Have you had any weight changes? : No Change Sleep: No Change Total Hours of Sleep: 8 Vegetative Symptoms: None  ADLScreening [facility] Assessment Services) Patient's cognitive ability adequate to safely complete daily activities?: Yes Patient able to express need for assistance with ADLs?: Yes Independently performs ADLs?: Yes (appropriate for developmental age)  Prior Inpatient Therapy Prior Inpatient Therapy: Yes Prior Therapy Dates: 01/2020 & 2019-2020 Prior Therapy Facilty/Provider(s): [facility] Reason for Treatment: MH  Prior Outpatient Therapy Prior Outpatient Therapy: Yes Prior Therapy Dates: Current Prior Therapy Facilty/Provider(s): [facility] Reason for Treatment: MH Does patient have an ACCT team?: Yes Does patient have Intensive In-House Services?  : No Does patient have [facility] services? : No Does patient have P4CC services?: No  ADL Screening (condition at time of admission) Patient's cognitive ability adequate to safely complete daily activities?: Yes Is the patient deaf or have difficulty hearing?: No Does the patient have difficulty seeing, even when wearing glasses/contacts?: No Does the patient have difficulty concentrating, remembering, or making decisions?: No Patient able to express need for assistance with ADLs?: Yes Does the patient have difficulty dressing or bathing?: No Independently performs ADLs?: Yes (appropriate for developmental age) Does the patient have difficulty walking or climbing stairs?: No Weakness of Legs: None Weakness of Arms/Hands: None  Home Assistive Devices/Equipment Home Assistive Devices/Equipment: None  Therapy Consults (therapy consults require a physician order) PT Evaluation Needed: No OT Evalulation Needed: No SLP Evaluation Needed: No Abuse/Neglect Assessment (Assessment to be complete while patient is alone) Abuse/Neglect Assessment Can Be Completed: Yes Physical Abuse: Denies Verbal Abuse: Denies Sexual Abuse: Denies Exploitation of patient/patient's resources: Denies Self-Neglect: Denies Values / Beliefs Cultural Requests During Hospitalization: None Spiritual Requests During Hospitalization: None Consults Spiritual Care Consult Needed: No Transition of Care Team Consult Needed: No Advance Directives (For Healthcare) Does Patient Have a Medical Advance Directive?: No  Disposition:  Disposition Initial Assessment Completed for this Encounter: Yes Per Psychiatric Nurse Practitioner, Malachy Chamber, patient is Psych cleared and patient is to have Social Work Consult for assistance/information with housing.    On Site Evaluation by:   Reviewed with  Physician:    Lilyan Gilford MS, LCAS, [facility], [facility] Therapeutic Triage Specialist 02/24/2020 5:43 PM

## 2020-02-24 NOTE — ED Notes (Signed)
Food tray was offered. Pt refused. Warm blankets were provided.

## 2020-02-24 NOTE — ED Triage Notes (Signed)
PT arrives via POV stating he does not feel comfortable living by himself anymore. PT states he is worried he is going to hurt himself and has been feeling this way for the past 2 weeks. Pt has not tried to hurt himself yet.

## 2020-02-24 NOTE — ED Notes (Signed)
Pt given dinner tray for snack.

## 2020-02-24 NOTE — ED Notes (Addendum)
This RN spoke with EDP regarding orders, VORB for UDS at this time. Per [place], no need for repeat blood work.

## 2020-02-24 NOTE — ED Provider Notes (Signed)
[facility] Emergency Department Provider Note  ____________________________________________   First MD Initiated Contact with Patient 02/24/20 1354     (approximate)  I have reviewed the triage vital signs and the nursing notes.  History  Chief Complaint Psychiatric Evaluation    HPI Clinton Hart is a 39 y.o. male w/ hx of schizophrenia, who presents for psychiatric evaluation. He states he doesn't "feel like he is here" - like he is having an out of body experience. He is upset that when he was here last he was only admitted for a day and is asking to stay longer. He reports vague SI w/o a plan. Denies HI. Denies hallucinations. Reports medication compliance. Patient presents voluntarily seeking care.    Past Medical Hx Past Medical History:  Diagnosis Date  . Schizophrenia [facility])     Problem List Patient Active Problem List   Diagnosis Date Noted  . Schizoaffective disorder ([place]) 02/22/2020    Past Surgical Hx History reviewed. No pertinent surgical history.  Medications Prior to Admission medications   Medication Sig Start Date End Date Taking? Authorizing Provider  benztropine (COGENTIN) 0.5 MG tablet Take 0.5 tablets (0.25 mg total) by mouth daily. 02/24/20   Clapacs, Madie Reno, MD  D3 SUPER STRENGTH 50 MCG (2000 UT) CAPS Take 1 capsule (2,000 Units total) by mouth daily. 02/23/20   Clapacs, Madie Reno, MD  risperiDONE (RISPERDAL) 4 MG tablet Take 1 tablet (4 mg total) by mouth daily. 02/24/20   Clapacs, Madie Reno, MD  traZODone (DESYREL) 100 MG tablet Take 1 tablet (100 mg total) by mouth at bedtime as needed for sleep. 02/23/20   Clapacs, Madie Reno, MD    Allergies Shellfish allergy  Family Hx History reviewed. No pertinent family history.  Social Hx Social History   Tobacco Use  . Smoking status: Current Every Day Smoker  . Smokeless tobacco: Never Used  Substance Use Topics  . Alcohol use: Never  . Drug use: Never     Review of  Systems  Constitutional: Negative for fever, chills. Eyes: Negative for visual changes. ENT: Negative for sore throat. Cardiovascular: Negative for chest pain. Respiratory: Negative for shortness of breath. Gastrointestinal: Negative for nausea, vomiting.  Genitourinary: Negative for dysuria. Musculoskeletal: Negative for leg swelling. Skin: Negative for rash. Neurological: Negative for for headaches.   Physical Exam  Vital Signs: ED Triage Vitals [02/24/20 1312]  Enc Vitals Group     BP 128/71     Pulse Rate 92     Resp 18     Temp 98.6 F (37 C)     Temp Source Oral     SpO2 98 %     Weight 190 lb (86.2 kg)     Height 5\' 9"  (1.753 m)     Head Circumference      Peak Flow      Pain Score 0     Pain Loc      Pain Edu?      Excl. in [place]?     Constitutional: Alert and oriented.  Head: Normocephalic. Atraumatic. Eyes: Conjunctivae clear. Sclera anicteric. Nose: No congestion. No rhinorrhea. Mouth/Throat: Mucous membranes are moist.  Neck: No stridor.   Cardiovascular: Normal rate. Extremities well perfused. Respiratory: Normal respiratory effort.   Gastrointestinal: Non-distended.  Musculoskeletal: No deformities. Neurologic:  Normal speech and language. No gross focal neurologic deficits are appreciated.  Skin: Skin is warm, dry and intact.  Psychiatric: Reports vague SI, out of body feeling.  EKG  N/A    Radiology  N/A   Procedures  Procedure(s) performed (including critical care):  Procedures   Initial Impression / Assessment and Plan / ED Course  39 y.o. male who presents to the ED for psychiatric evaluation, as above.   Suspect patient's presentation is related to their known psychiatric diagnosis. No evidence of acute infectious or metabolic etiology based on history and exam. Patient has had recent blood work, as he was just admitted here from 6/3 to 6/4. Do not feel repeat blood work necessary. Will obtain UDS and consult psychiatry and TTS.    The patient has been placed in psychiatric observation due to the need to provide a safe environment for the patient while obtaining psychiatric consultation and evaluation, as well as ongoing medical and medication management to treat the patient's condition.  The patient has not been placed under full IVC at this time. He presents voluntarily seeking care. He is in agreement w/ the plan.    Final Clinical Impression(s) / ED Diagnosis  Evaluation by psychiatric services required Hx schizophrenia     Note:  This document was prepared using Dragon voice recognition software and may include unintentional dictation errors.   Miguel Aschoff., MD 02/24/20 1728

## 2020-02-25 ENCOUNTER — Other Ambulatory Visit: Payer: Self-pay

## 2020-02-25 NOTE — ED Provider Notes (Signed)
-----------------------------------------   9:03 AM on 02/25/2020 -----------------------------------------  The patient has been seen by NP Lewis from the behavioral service and is cleared for discharge home.  There is no evidence of danger to self or others.  The patient is not under involuntary commitment.  Discharge instructions and return precautions provided.   Dionne Bucy, MD 02/25/20 [phone]

## 2020-02-25 NOTE — ED Notes (Signed)
Pt given breakfast tray

## 2020-02-25 NOTE — ED Provider Notes (Signed)
Emergency Medicine Observation Re-evaluation Note  Clinton Hart is a 39 y.o. male, seen on rounds today.  Pt initially presented to the ED for complaints of Psychiatric Evaluation Currently, the patient is resting in no acute distress.  Physical Exam  BP 113/71 (BP Location: Left Arm)   Pulse 69   Temp 98.6 F (37 C) (Oral)   Resp 18   Ht 5\' 9"  (1.753 m)   Wt 86.2 kg   SpO2 98%   BMI 28.06 kg/m  Physical Exam  ED Course / MDM  EKG:    I have reviewed the labs performed to date as well as medications administered while in observation.  Recent changes in the last 24 hours include no events overnight. Plan  Current plan is for psychiatric disposition. Patient is not under full IVC at this time.   , MD 02/25/20 [phone]

## 2020-02-25 NOTE — ED Notes (Signed)
Pt awake and remote given to pt to control TV. Pt denies any needs at this time.

## 2020-02-25 NOTE — Consult Note (Signed)
[facility] Face-to-Face Psychiatry Consult   Reason for Consult:  Anxiety and Depression Referring Physician:  EPD Patient Identification: Clinton SURGEON MRN:  [id number] Principal Diagnosis: <principal problem not specified> Diagnosis:  Active Problems:   * No active hospital problems. *   Total Time spent with patient: 15 minutes  Subjective:   Clinton Hart is a 39 y.o. male was seen and evaluated by nurse practitioner and TTS counselor.  Currently denying suicidal or homicidal ideations.  Denies auditory or visual hallucinations.  Patient reported " I was feeling suicidal on yesterday".  Chart reviewed patiently recently discharged from inpatient admission for similar symptoms.  He reports he has his car outside and is ready to leave.  Patient reports he has follow-up appointments from recent inpatient admission.  Patient is denying depression or depressive symptoms.  Case staffed with attending psychiatrist Lucianne Muss.  Patient to keep follow-up appointment with all outpatient provider.  Support encouragement reassurance was provided.  HPI:  Per admission assessment note: Clinton Hart is an 39 y.o. male who presents to the ER due to having thoughts of death but have no plans, gestures or intentions of ending his life. Patient further reports, his current stressor is being afraid of living alone. He reports of "not feeling right." When asked to explain, patient was having difficulty explaining what he meant. Thus, he shared he has lived alone for approximately a year. His parents passed approximately two years ago and this is the first time he has had to live alone. He's requesting to go to a group home.  Past Psychiatric History:  Risk to Self: Suicidal Ideation: No Suicidal Intent: No Is patient at risk for suicide?: No Suicidal Plan?: No Access to Means: No What has been your use of drugs/alcohol within the last 12 months?: Reports of none Other Self Harm Risks: Reports of  none Triggers for Past Attempts: None known Intentional Self Injurious Behavior: None Risk to Others: Homicidal Ideation: No Thoughts of Harm to Others: No Current Homicidal Intent: No Current Homicidal Plan: No Access to Homicidal Means: No Identified Victim: Reports of none History of harm to others?: No Assessment of Violence: None Noted Violent Behavior Description: Reports of none Does patient have access to weapons?: No Does patient have a court date: No Prior Inpatient Therapy: Prior Inpatient Therapy: Yes Prior Therapy Dates: 01/2020 & 2019-2020 Prior Therapy Facilty/Provider(s): [facility] Reason for Treatment: MH Prior Outpatient Therapy: Prior Outpatient Therapy: Yes Prior Therapy Dates: Current Prior Therapy Facilty/Provider(s): [facility] Reason for Treatment: MH Does patient have an ACCT team?: Yes Does patient have Intensive In-House Services?  : No Does patient have [facility] services? : No Does patient have P4CC services?: No  Past Medical History:  Past Medical History:  Diagnosis Date  . Schizophrenia (H[facility])    History reviewed. No pertinent surgical history. Family History: History reviewed. No pertinent family history. Family Psychiatric  History:  Social History:  Social History   Substance and Sexual Activity  Alcohol Use Never     Social History   Substance and Sexual Activity  Drug Use Never    Social History   Socioeconomic History  . Marital status: Single    Spouse name: Not on file  . Number of children: Not on file  . Years of education: Not on file  . Highest education level: Not on file  Occupational History  . Not on file  Tobacco Use  . Smoking status: Current Every  Day Smoker  . Smokeless tobacco: Never Used  Substance and Sexual Activity  . Alcohol use: Never  . Drug use: Never  . Sexual activity: Not on file  Other Topics Concern  . Not on file  Social History Narrative  . Not on file    Social Determinants of Health   Financial Resource Strain:   . Difficulty of Paying Living Expenses:   Food Insecurity:   . Worried About Charity fundraiser in the Last Year:   . Arboriculturist in the Last Year:   Transportation Needs:   . Film/video editor (Medical):   Marland Kitchen Lack of Transportation (Non-Medical):   Physical Activity:   . Days of Exercise per Week:   . Minutes of Exercise per Session:   Stress:   . Feeling of Stress :   Social Connections:   . Frequency of Communication with Friends and Family:   . Frequency of Social Gatherings with Friends and Family:   . Attends Religious Services:   . Active Member of Clubs or Organizations:   . Attends Archivist Meetings:   Marland Kitchen Marital Status:    Additional Social History:    Allergies:   Allergies  Allergen Reactions  . Shellfish Allergy Anaphylaxis    Labs:  Results for orders placed or performed during the hospital encounter of 02/24/20 (from the past 48 hour(s))  Urine Drug Screen, Qualitative ([place] only)     Status: None   Collection Time: 02/24/20  1:20 PM  Result Value Ref Range   Tricyclic, Ur Screen NONE DETECTED NONE DETECTED   Amphetamines, Ur Screen NONE DETECTED NONE DETECTED   MDMA (Ecstasy)Ur Screen NONE DETECTED NONE DETECTED   Cocaine Metabolite,Ur [place] NONE DETECTED NONE DETECTED   Opiate, Ur Screen NONE DETECTED NONE DETECTED   Phencyclidine (PCP) Ur S NONE DETECTED NONE DETECTED   Cannabinoid 50 Ng, Ur [place] NONE DETECTED NONE DETECTED   Barbiturates, Ur Screen NONE DETECTED NONE DETECTED   Benzodiazepine, Ur Scrn NONE DETECTED NONE DETECTED   Methadone Scn, Ur NONE DETECTED NONE DETECTED    Comment: (NOTE) Tricyclics + metabolites, urine    Cutoff 1000 ng/mL Amphetamines + metabolites, urine  Cutoff 1000 ng/mL MDMA (Ecstasy), urine              Cutoff 500 ng/mL Cocaine Metabolite, urine          Cutoff 300 ng/mL Opiate + metabolites, urine        Cutoff 300 ng/mL Phencyclidine  (PCP), urine         Cutoff 25 ng/mL Cannabinoid, urine                 Cutoff 50 ng/mL Barbiturates + metabolites, urine  Cutoff 200 ng/mL Benzodiazepine, urine              Cutoff 200 ng/mL Methadone, urine                   Cutoff 300 ng/mL The urine drug screen provides only a preliminary, unconfirmed analytical test result and should not be used for non-medical purposes. Clinical consideration and professional judgment should be applied to any positive drug screen result due to possible interfering substances. A more specific alternate chemical method must be used in order to obtain a confirmed analytical result. Gas chromatography / mass spectrometry (GC/MS) is the preferred confirmat ory method. Performed at [facility], [address], [place], [place] [address]     No  current facility-administered medications for this encounter.   Current Outpatient Medications  Medication Sig Dispense Refill  . benztropine (COGENTIN) 0.5 MG tablet Take 0.5 tablets (0.25 mg total) by mouth daily. 30 tablet 1  . D3 SUPER STRENGTH 50 MCG (2000 UT) CAPS Take 1 capsule (2,000 Units total) by mouth daily. 30 capsule 1  . risperiDONE (RISPERDAL) 4 MG tablet Take 1 tablet (4 mg total) by mouth daily. 30 tablet 1  . traZODone (DESYREL) 100 MG tablet Take 1 tablet (100 mg total) by mouth at bedtime as needed for sleep. 30 tablet 1    Musculoskeletal: Strength & Muscle Tone: within normal limits Gait & Station: normal Patient leans: N/A  Psychiatric Specialty Exam: Physical Exam  Vitals reviewed. Skin: Skin is warm.  Psychiatric: He has a normal mood and affect. His behavior is normal.    Review of Systems  Psychiatric/Behavioral: Negative for suicidal ideas. The patient is nervous/anxious.   All other systems reviewed and are negative.   Blood pressure 113/78, pulse 81, temperature 98.8 F (37.1 C), temperature source Oral, resp. rate 17, height 5\' 9"  (1.753 m), weight 86.2 kg,  SpO2 99 %.Body mass index is 28.06 kg/m.  General Appearance: Casual  Eye Contact:  Good  Speech:  Clear and Coherent  Volume:  Normal  Mood:  Anxious and Depressed  Affect:  Appropriate  Thought Process:  Coherent  Orientation:  Full (Time, Place, and Person)  Thought Content:  Logical  Suicidal Thoughts:  No  Homicidal Thoughts:  No  Memory:  Immediate;   Fair Remote;   Fair  Judgement:  Fair  Insight:  Fair  Psychomotor Activity:  Normal  Concentration:  Concentration: Fair  Recall:  of Knowledge:  Fair  Language:  Fair  Akathisia:  No  Handed:  Right  AIMS (if indicated):     Assets:  Communication Skills Desire for Improvement Social Support  ADL's:  Intact  Cognition:  WNL  Sleep:      NP spoke to EDP regarding discharge disposition recommendation CSW to follow-up with additional outpatient resources  Disposition: No evidence of imminent risk to self or others at present.   Patient does not meet criteria for psychiatric inpatient admission. Supportive therapy provided about ongoing stressors. Refer to IOP. Discussed crisis plan, support from social network, calling 911, coming to the Emergency Department, and calling Suicide Hotline.  [employer], NP 02/25/2020 8:55 AM

## 2020-02-25 NOTE — ED Notes (Signed)
Pt given belongings and dressed back into his clothes. Pt verbalized discharge instructions and states no needs at this time.  Pt ambulatory with steady gait.

## 2020-02-25 NOTE — Discharge Instructions (Addendum)
Follow-up with your primary psychiatrist.  Continue to take your medications as prescribed.  Return to the ER for new, worsening, or recurrent suicidal thoughts, feelings of wanting to harm yourself or others, or any other new or worsening symptoms that concern you.

## 2020-02-28 ENCOUNTER — Encounter: Payer: Self-pay | Admitting: Emergency Medicine

## 2020-02-28 ENCOUNTER — Other Ambulatory Visit: Payer: Self-pay

## 2020-02-28 ENCOUNTER — Emergency Department
Admission: EM | Admit: 2020-02-28 | Discharge: 2020-03-01 | Disposition: A | Discharge status: Other Acute Inpatient Hospital | Payer: Medicare Other | Attending: Emergency Medicine | Admitting: Emergency Medicine

## 2020-02-28 DIAGNOSIS — R45851 Suicidal ideations: Secondary | ICD-10-CM | POA: Insufficient documentation

## 2020-02-28 DIAGNOSIS — F259 Schizoaffective disorder, unspecified: Secondary | ICD-10-CM | POA: Diagnosis present

## 2020-02-28 DIAGNOSIS — Z9114 Patient's other noncompliance with medication regimen: Secondary | ICD-10-CM | POA: Insufficient documentation

## 2020-02-28 DIAGNOSIS — Z046 Encounter for general psychiatric examination, requested by authority: Secondary | ICD-10-CM | POA: Diagnosis not present

## 2020-02-28 DIAGNOSIS — F329 Major depressive disorder, single episode, unspecified: Secondary | ICD-10-CM | POA: Insufficient documentation

## 2020-02-28 DIAGNOSIS — Z20822 Contact with and (suspected) exposure to covid-19: Secondary | ICD-10-CM | POA: Insufficient documentation

## 2020-02-28 DIAGNOSIS — Z79899 Other long term (current) drug therapy: Secondary | ICD-10-CM | POA: Diagnosis not present

## 2020-02-28 LAB — URINE DRUG SCREEN, QUALITATIVE (ARMC ONLY)
Amphetamines, Ur Screen: NOT DETECTED
Barbiturates, Ur Screen: NOT DETECTED
Benzodiazepine, Ur Scrn: NOT DETECTED
Cannabinoid 50 Ng, Ur ~~LOC~~: NOT DETECTED
Cocaine Metabolite,Ur ~~LOC~~: NOT DETECTED
MDMA (Ecstasy)Ur Screen: NOT DETECTED
Methadone Scn, Ur: NOT DETECTED
Opiate, Ur Screen: NOT DETECTED
Phencyclidine (PCP) Ur S: NOT DETECTED
Tricyclic, Ur Screen: NOT DETECTED

## 2020-02-28 LAB — COMPREHENSIVE METABOLIC PANEL
ALT: 50 U/L — ABNORMAL HIGH (ref 0–44)
AST: 29 U/L (ref 15–41)
Albumin: 4.3 g/dL (ref 3.5–5.0)
Alkaline Phosphatase: 49 U/L (ref 38–126)
Anion gap: 8 (ref 5–15)
BUN: 9 mg/dL (ref 6–20)
CO2: 28 mmol/L (ref 22–32)
Calcium: 9.3 mg/dL (ref 8.9–10.3)
Chloride: 100 mmol/L (ref 98–111)
Creatinine, Ser: 1.06 mg/dL (ref 0.61–1.24)
GFR calc Af Amer: >60 mL/min (ref >60)
GFR calc non Af Amer: >60 mL/min (ref >60)
Glucose, Bld: 96 mg/dL (ref 70–99)
Potassium: 3.7 mmol/L (ref 3.5–5.1)
Sodium: 136 mmol/L (ref 135–145)
Total Bilirubin: 0.8 mg/dL (ref 0.3–1.2)
Total Protein: 8.1 g/dL (ref 6.5–8.1)

## 2020-02-28 LAB — ETHANOL: Alcohol, Ethyl (B): <10 mg/dL (ref <10)

## 2020-02-28 LAB — CBC
HCT: 44.5 % (ref 39.0–52.0)
Hemoglobin: 15.6 g/dL (ref 13.0–17.0)
MCH: 30.5 pg (ref 26.0–34.0)
MCHC: 35.1 g/dL (ref 30.0–36.0)
MCV: 87.1 fL (ref 80.0–100.0)
Platelets: 266 10*3/uL (ref 150–400)
RBC: 5.11 MIL/uL (ref 4.22–5.81)
RDW: 12.9 % (ref 11.5–15.5)
WBC: 6.5 10*3/uL (ref 4.0–10.5)
nRBC: 0 % (ref 0.0–0.2)

## 2020-02-28 LAB — SARS CORONAVIRUS 2 BY RT PCR (HOSPITAL ORDER, PERFORMED IN ~~LOC~~ HOSPITAL LAB): SARS Coronavirus 2: NEGATIVE

## 2020-02-28 LAB — ACETAMINOPHEN LEVEL: Acetaminophen (Tylenol), Serum: <10 ug/mL — ABNORMAL LOW (ref 10–30)

## 2020-02-28 LAB — SALICYLATE LEVEL: Salicylate Lvl: <7 mg/dL — ABNORMAL LOW (ref 7.0–30.0)

## 2020-02-28 MED ORDER — TRAZODONE HCL 50 MG PO TABS
50.0000 mg | ORAL_TABLET | Freq: Every evening | ORAL | Status: DC | PRN
  Administered 2020-02-29: 50 mg via ORAL
  Filled 2020-02-28: qty 1

## 2020-02-28 MED ORDER — LORAZEPAM 1 MG PO TABS
1.0000 mg | ORAL_TABLET | Freq: Once | ORAL | Status: AC
  Administered 2020-02-28: 1 mg via ORAL
  Filled 2020-02-28: qty 1

## 2020-02-28 NOTE — ED Notes (Signed)
Pt transferred into ED BHU room 4  Patient assigned to appropriate care area. Patient oriented to unit/care area: Informed that, for his safety, care areas are designed for safety and monitored by security cameras at all times; Visiting hours and phone times explained to patient. Patient verbalizes understanding, and verbal contract for safety obtained.    Assessment completed  He denies pain  Verbalizes "I feel anxious - may I walk around for awhile"    Ginger ale provided

## 2020-02-28 NOTE — ED Notes (Signed)
VOL, pending placement 

## 2020-02-28 NOTE — BH Assessment (Signed)
Assessment Note  Clinton Hart is an 39 y.o. male who presented to [facility] ED voluntarily for treatment. Per triage note, Here for SI. When asked if he had a plan pt states "I would think of something".  Reports he was suicidal a couple days ago and then got better but now feeling suicidal again.  + psych hx, requesting to see dr clapacs.  During TTS assessment pt presented calm, depressed and oriented x 4. Pt confirmed the information provided to the triage RN and stated "I just don't feel good". Pt reports SI without a plan due to feeling depressed, worthless, and down. Pt identified feeling lonely and reports to be currently isolation himself stating, "All I do is stay home all day by myself and away from people". Pt expressed interest in volunteering and needs to engage in therapy at least 2-3x's a week. Pt confirmed active services with his ACT team and stated "my nurse drops my medications off every Tuesday and I speak to my therapist 1x a month". Pt denies any current SA. Pt was recently discharged on 02/24/20 but stated he needed to come because he "didn't feel good". In attempt to explore his statement pt was unable to explain. Pt reports feeling he has no one now that his parents are deceased. Pt is requesting inpatient treatment until he feels better.   Pt denies any current HI/AH/VH and is currently unable to contract for safety.   Disposition is pending psych consult  Diagnosis: Schizophrenia   Past Medical History:  Past Medical History:  Diagnosis Date  . Schizophrenia (HCC)     History reviewed. No pertinent surgical history.  Family History: History reviewed. No pertinent family history.  Social History:  reports that he has been smoking. He has never used smokeless tobacco. He reports that he does not drink alcohol or use drugs.  Additional Social History:  Alcohol / Drug Use Pain Medications: see mar Prescriptions: see mar Over the Counter: see mar History of alcohol /  drug use?: No history of alcohol / drug abuse  CIWA: CIWA-Ar BP: 117/77 Pulse Rate: 90 COWS:    Allergies:  Allergies  Allergen Reactions  . Shellfish Allergy Anaphylaxis    Home Medications: (Not in a hospital admission)   OB/GYN Status:  No LMP for male patient.  General Assessment Data Location of Assessment: [facility] ED TTS Assessment: In system Is this a Tele or Face-to-Face Assessment?: Face-to-Face Is this an Initial Assessment or a Re-assessment for this encounter?: Initial Assessment Patient Accompanied by:: N/A Language Other than English: No Living Arrangements: Other (Comment) What gender do you identify as?: Male Marital status: Single Maiden name: n/a Pregnancy Status: No Living Arrangements: Alone Can pt return to current living arrangement?: Yes Admission Status: Voluntary Is patient capable of signing voluntary admission?: Yes Referral Source: Self/Family/Friend Insurance type: Medicare  Medical Screening Exam [facility]) Medical Exam completed: Yes  Crisis Care Plan Living Arrangements: Alone Legal Guardian: (self) Name of Psychiatrist: [facility] Name of Therapist: [facility]  Education Status Is patient currently in school?: No Is the patient employed, unemployed or receiving disability?: Unemployed, Receiving disability income  Risk to self with the past 6 months Suicidal Ideation: Yes-Currently Present Has patient been a risk to self within the past 6 months prior to admission? : No Suicidal Intent: No Has patient had any suicidal intent within the past 6 months prior to admission? : No Is patient at risk  for suicide?: No Suicidal Plan?: No Has patient had any suicidal plan within the past 6 months prior to admission? : No Access to Means: No What has been your use of drugs/alcohol within the last 12 months?: (None reported ) Previous Attempts/Gestures: No How many times?:  0 Other Self Harm Risks: (None reported ) Triggers for Past Attempts: None known Intentional Self Injurious Behavior: None Family Suicide History: No Recent stressful life event(s): Loss (Comment)(Parents ) Persecutory voices/beliefs?: No Depression: Yes Depression Symptoms: Loss of interest in usual pleasures, Feeling worthless/self pity Substance abuse history and/or treatment for substance abuse?: No Suicide prevention information given to non-admitted patients: Yes  Risk to Others within the past 6 months Homicidal Ideation: No Does patient have any lifetime risk of violence toward others beyond the six months prior to admission? : No Thoughts of Harm to Others: No Current Homicidal Intent: No Current Homicidal Plan: No Access to Homicidal Means: No Identified Victim: n/a History of harm to others?: No Assessment of Violence: None Noted Violent Behavior Description: n/a Does patient have access to weapons?: No Criminal Charges Pending?: No Does patient have a court date: No Is patient on probation?: No  Psychosis Hallucinations: None noted Delusions: None noted  Mental Status Report Appearance/Hygiene: Unremarkable, In scrubs Eye Contact: Good Motor Activity: Unremarkable Speech: Logical/coherent Level of Consciousness: Alert Mood: Depressed, Sad, Worthless, low self-esteem Affect: Depressed, Sad Anxiety Level: None Thought Processes: Coherent, Relevant Judgement: Partial Orientation: Person, Place, Time, Situation, Appropriate for developmental age Obsessive Compulsive Thoughts/Behaviors: None  Cognitive Functioning Concentration: Good Memory: Recent Intact, Remote Intact Is patient IDD: No Insight: Good Impulse Control: Good Appetite: Good Have you had any weight changes? : No Change Amount of the weight change? (lbs): (n/a) Sleep: No Change Total Hours of Sleep: 8 Vegetative Symptoms: None     Prior Inpatient Therapy Prior Inpatient Therapy:  Yes Prior Therapy Dates: 01/2020 & 2019-2020 Prior Therapy Facilty/Provider(s): [facility] Reason for Treatment: MH  Prior Outpatient Therapy Prior Outpatient Therapy: Yes Prior Therapy Dates: Current Prior Therapy Facilty/Provider(s): [facility] Reason for Treatment: MH Does patient have an ACCT team?: Yes Does patient have Intensive In-House Services?  : No Does patient have [facility] services? : No Does patient have P4CC services?: No  ADL Screening (condition at time of admission) Is the patient deaf or have difficulty hearing?: No Does the patient have difficulty seeing, even when wearing glasses/contacts?: No Does the patient have difficulty concentrating, remembering, or making decisions?: No Does the patient have difficulty dressing or bathing?: No Does the patient have difficulty walking or climbing stairs?: No Weakness of Legs: None Weakness of Arms/Hands: None  Home Assistive Devices/Equipment Home Assistive Devices/Equipment: None  Therapy Consults (therapy consults require a physician order) PT Evaluation Needed: No OT Evalulation Needed: No SLP Evaluation Needed: No Abuse/Neglect Assessment (Assessment to be complete while patient is alone) Abuse/Neglect Assessment Can Be Completed: Yes Physical Abuse: Denies Verbal Abuse: Denies Sexual Abuse: Denies Exploitation of patient/patient's resources: Denies Self-Neglect: Denies Values / Beliefs Cultural Requests During Hospitalization: None Spiritual Requests During Hospitalization: None Consults Spiritual Care Consult Needed: No Transition of Care Team Consult Needed: No Advance Directives (For Healthcare) Does Patient Have a Medical Advance Directive?: No          Disposition:  Disposition Initial Assessment Completed for this Encounter: Yes Patient referred to: Other (Comment)  On Site Evaluation by:   Reviewed with Physician:    Shanon Ace 02/28/2020 5:52  PM

## 2020-02-28 NOTE — ED Notes (Signed)
Patient talking with Dr. Mayford Knife

## 2020-02-28 NOTE — ED Notes (Signed)
Patient has been appropriate and cooperative. Patient is lying in bed. NAD noted

## 2020-02-28 NOTE — ED Notes (Signed)
Patient given snack.  

## 2020-02-28 NOTE — ED Provider Notes (Signed)
  ER Provider Note       Time seen: 3:11 PM    I have reviewed the vital signs and the nursing notes.  HISTORY   Chief Complaint Psychiatric Evaluation    HPI Clinton Hart is a 39 y.o. male with a history of schizophrenia who presents today for suicidal ideation.  Patient states for several weeks he has been having suicidal thoughts.  He states he stopped taking his medication several days ago but did not feel like they were helping.  He denies any recent illness or other complaints.  Has never tried to kill himself.  Past Medical History:  Diagnosis Date  . Schizophrenia (HCC)     History reviewed. No pertinent surgical history.  Allergies Shellfish allergy   Review of Systems Constitutional: Negative for fever. Cardiovascular: Negative for chest pain. Respiratory: Negative for shortness of breath. Gastrointestinal: Negative for abdominal pain, vomiting and diarrhea. Musculoskeletal: Negative for back pain. Skin: Negative for rash. Neurological: Negative for headaches, focal weakness or numbness. Psychiatric: Positive for suicidal ideation  All systems negative/normal/unremarkable except as stated in the HPI  ____________________________________________   PHYSICAL EXAM:  VITAL SIGNS: Vitals:   02/28/20 1431  BP: 117/77  Pulse: 90  Resp: 16  Temp: 98.2 F (36.8 C)  SpO2: 99%    Constitutional: Alert and oriented. Well appearing and in no distress. Eyes: Conjunctivae are normal. Normal extraocular movements. ENT      Head: Normocephalic and atraumatic.      Nose: No congestion/rhinnorhea.      Mouth/Throat: Mucous membranes are moist.      Neck: No stridor. Cardiovascular: Normal rate, regular rhythm. No murmurs, rubs, or gallops. Respiratory: Normal respiratory effort without tachypnea nor retractions. Breath sounds are clear and equal bilaterally. No wheezes/rales/rhonchi. Gastrointestinal: Soft and nontender. Normal bowel  sounds Musculoskeletal: Nontender with normal range of motion in extremities. No lower extremity tenderness nor edema. Neurologic:  Normal speech and language. No gross focal neurologic deficits are appreciated.  Skin:  Skin is warm, dry and intact. No rash noted. Psychiatric: Speech and behavior are normal.  ___________________________________________   LABS (pertinent positives/negatives)  Labs Reviewed  ETHANOL  CBC  COMPREHENSIVE METABOLIC PANEL  SALICYLATE LEVEL  ACETAMINOPHEN LEVEL  URINE DRUG SCREEN, QUALITATIVE ([facility] ONLY)   DIFFERENTIAL DIAGNOSIS  Schizophrenia, medication noncompliance, suicidal ideation  ASSESSMENT AND PLAN  Schizophrenia, suicidal ideation   Plan: The patient had presented for suicidal ideation. Patient's labs are unremarkable, he appears medically clear for psychiatric evaluation and disposition.   Daryel November MD    Note: This note was generated in part or whole with voice recognition software. Voice recognition is usually quite accurate but there are transcription errors that can and very often do occur. I apologize for any typographical errors that were not detected and corrected.     Emily Filbert, MD 02/28/20 [phone]

## 2020-02-28 NOTE — ED Notes (Signed)
Pt unable to urinate at this time, pt given specimen cup for when is able to urinate. Pt ambulated back to 19H with Mardelle Matte, [employer].

## 2020-02-28 NOTE — ED Triage Notes (Signed)
Here for SI. When asked if he had a plan pt states "I would think of something".  Reports he was suicidal a couple days ago and then got better but now feeling suicidal again.  + psych hx, requesting to see dr clapacs.

## 2020-02-28 NOTE — ED Notes (Addendum)
Dressed out by this Charity fundraiser and andy EMP.    1 t shirt 1 pair sandals 1 pair shorts 1 cell phone 1 pair shorts 1 set keys

## 2020-02-29 NOTE — BH Assessment (Signed)
Patient has been accepted to [facility].  Patient assigned to: [facility]  Accepting physician is Dr. Loyola Mast.  Call report to [phone] Representative was Aqua.    ER Staff is aware of it:  Carlene, ER Secretary  Derrill Kay, ER MD  Amy Patient's Nurse  Patient is unable to provide family/support information at this time.      Patient's Family/Support System Liborio Nixon (ACTT), [phone] [phone]) have been updated as well.   PATIENT IS SCHEDULED FOR ADMISSION ANYTIME AFTER 9AM TOMORROW

## 2020-02-29 NOTE — BH Assessment (Signed)
This Clinical research associate contacted patient's ACT Team, [employer] at [phone] EXT 4 and was connected to patent's ACT team crisis line. Writer spoke with ACT team member Liborio Nixon who reports that they did not know patient was consistently presenting to the ED. Liborio Nixon reports that they come see patient twice a week and call him daily and reports patient is going to [facility] to obtain his GED. Liborio Nixon reports that she will discuss him and his presentations to the ED with with ACT team tomorrow morning.

## 2020-02-29 NOTE — Consult Note (Signed)
[facility] Face-to-Face Psychiatry Consult   Reason for Consult: Psychiatric Evaluation Referring Physician: Dr. Mayford KnifeWilliams Patient Identification: Clinton Hart MRN:  [id number] Principal Diagnosis: <principal problem not specified> Diagnosis:  Active Problems:   Schizoaffective disorder (HCC)   Total Time spent with patient: 15 minutes  Subjective: " I want to kill myself.  I don't know how I am going to do it." Clinton Hart is a 39 y.o. male [facility] ED via POV voluntarily voicing he has suicidal ideation. The patient states he has an ACT team, but "it's been a minute since I saw them." The patient voiced he is alone and has no one to socialize with. The patient expressed that he was adopted, and both of his adoptive parents are deceased and his biological parents. He voiced his siblings are all busy, and he feels like he is in their way. The patient stated he is [facility] with [facility] ACT team, but he has not been associated with the team. He voiced that his medications are not working for him. He was educated that his ACT team is responsible for prescribing him medications and monitoring him until they find a drug regimen that works for him. The patient was seen face-to-face by this provider; the chart was reviewed and consulted with Dr. Mayford KnifeWilliams on 02/28/2020 due to the patient's care. It was discussed with the EDP that the patient does not meet the criteria to be admitted to the psychiatric inpatient unit. However, the patient has an ACT which he needs to connect with them. On evaluation, the patient is alert and oriented x 4, calm, flat but cooperative, and mood-congruent with affect.  The patient does not appear to be responding to internal or external stimuli. Neither is the patient presenting with any delusional thinking. The patient denies auditory or visual hallucinations. The patient admits to suicidal ideations but denies homicidal and self-harm ideations. The patient  is not presenting with any psychotic or paranoid behaviors. During an encounter with the patient, he was able to answer questions appropriately. No collateral was obtained due to the patient voicing that there is no one to call. "I have no family for you to call." Plan: The patient is not a safety risk to self or others and does not require psychiatric inpatient admission for stabilization and treatment.  HPI:  Per Dr. Mayford KnifeWilliams: Clinton Hart is a 39 y.o. male with a history of schizophrenia who presents today for suicidal ideation.  Patient states for several weeks he has been having suicidal thoughts.  He states he stopped taking his medication several days ago but did not feel like they were helping.  He denies any recent illness or other complaints.  Has never tried to kill himself.  Past Psychiatric History: Schizophrenia (HCC)  Risk to Self: Suicidal Ideation: Yes-Currently Present Suicidal Intent: No Is patient at risk for suicide?: No Suicidal Plan?: No Access to Means: No What has been your use of drugs/alcohol within the last 12 months?: (None reported ) How many times?: 0 Other Self Harm Risks: (None reported ) Triggers for Past Attempts: None known Intentional Self Injurious Behavior: None Risk to Others: Homicidal Ideation: No Thoughts of Harm to Others: No Current Homicidal Intent: No Current Homicidal Plan: No Access to Homicidal Means: No Identified Victim: n/a History of harm to others?: No Assessment of Violence: None Noted Violent Behavior Description: n/a Does patient have access to weapons?: No Criminal Charges Pending?: No Does patient have a court date: No Prior  Inpatient Therapy: Prior Inpatient Therapy: Yes Prior Therapy Dates: 01/2020 & 2019-2020 Prior Therapy Facilty/Provider(s): [facility] Reason for Treatment: MH Prior Outpatient Therapy: Prior Outpatient Therapy: Yes Prior Therapy Dates: Current Prior Therapy Facilty/Provider(s): [facility] Reason for Treatment: MH Does patient have an ACCT team?: Yes Does patient have Intensive In-House Services?  : No Does patient have [facility] services? : No Does patient have P4CC services?: No  Past Medical History:  Past Medical History:  Diagnosis Date  . Schizophrenia (HCC)    History reviewed. No pertinent surgical history. Family History: History reviewed. No pertinent family history. Family Psychiatric  History:  Social History:  Social History   Substance and Sexual Activity  Alcohol Use Never     Social History   Substance and Sexual Activity  Drug Use Never    Social History   Socioeconomic History  . Marital status: Single    Spouse name: Not on file  . Number of children: Not on file  . Years of education: Not on file  . Highest education level: Not on file  Occupational History  . Not on file  Tobacco Use  . Smoking status: Current Every Day Smoker  . Smokeless tobacco: Never Used  Substance and Sexual Activity  . Alcohol use: Never  . Drug use: Never  . Sexual activity: Not on file  Other Topics Concern  . Not on file  Social History Narrative  . Not on file   Social Determinants of Health   Financial Resource Strain:   . Difficulty of Paying Living Expenses:   Food Insecurity:   . Worried About Programme researcher, broadcasting/film/video in the Last Year:   . Barista in the Last Year:   Transportation Needs:   . Freight forwarder (Medical):   Marland Kitchen Lack of Transportation (Non-Medical):   Physical Activity:   . Days of Exercise per Week:   . Minutes of Exercise per Session:   Stress:   . Feeling of Stress :   Social Connections:   . Frequency of Communication with Friends and Family:   . Frequency of Social Gatherings with Friends and Family:   . Attends Religious Services:   . Active Member of Clubs or Organizations:   . Attends Banker Meetings:   Marland Kitchen Marital Status:    Additional Social History:     Allergies:   Allergies  Allergen Reactions  . Shellfish Allergy Anaphylaxis    Labs:  Results for orders placed or performed during the hospital encounter of 02/28/20 (from the past 48 hour(s))  Comprehensive metabolic panel     Status: Abnormal   Collection Time: 02/28/20  2:29 PM  Result Value Ref Range   Sodium 136 135 - 145 mmol/L   Potassium 3.7 3.5 - 5.1 mmol/L   Chloride 100 98 - 111 mmol/L   CO2 28 22 - 32 mmol/L   Glucose, Bld 96 70 - 99 mg/dL    Comment: Glucose reference range applies only to samples taken after fasting for at least 8 hours.   BUN 9 6 - 20 mg/dL   Creatinine, Ser 7.35 0.61 - 1.24 mg/dL   Calcium 9.3 8.9 - 32.9 mg/dL   Total Protein 8.1 6.5 - 8.1 g/dL   Albumin 4.3 3.5 - 5.0 g/dL   AST 29 15 - 41 U/L   ALT 50 (H) 0 - 44 U/L   Alkaline Phosphatase 49 38 - 126 U/L  Total Bilirubin 0.8 0.3 - 1.2 mg/dL   GFR calc non Af Amer >60 >60 mL/min   GFR calc Af Amer >60 >60 mL/min   Anion gap 8 5 - 15    Comment: Performed at [facility], [address] [place], [place] [address]  Ethanol     Status: None   Collection Time: 02/28/20  2:29 PM  Result Value Ref Range   Alcohol, Ethyl (B) <10 <10 mg/dL    Comment: (NOTE) Lowest detectable limit for serum alcohol is 10 mg/dL. For medical purposes only. Performed at [facility], [address] [place], [place] [address]   Salicylate level     Status: Abnormal   Collection Time: 02/28/20  2:29 PM  Result Value Ref Range   Salicylate Lvl <7.0 (L) 7.0 - 30.0 mg/dL    Comment: Performed at [facility], [address] [place], [place] [address]  Acetaminophen level     Status: Abnormal   Collection Time: 02/28/20  2:29 PM  Result Value Ref Range   Acetaminophen (Tylenol), Serum <10 (L) 10 - 30 ug/mL    Comment: (NOTE) Therapeutic concentrations vary significantly. A range of 10-30 ug/mL  may be an effective concentration for many patients. However, some  are best  treated at concentrations outside of this range. Acetaminophen concentrations >150 ug/mL at 4 hours after ingestion  and >50 ug/mL at 12 hours after ingestion are often associated with  toxic reactions. Performed at [facility], [address] [place], [place] [address]   cbc     Status: None   Collection Time: 02/28/20  2:29 PM  Result Value Ref Range   WBC 6.5 4.0 - 10.5 K/uL   RBC 5.11 4.22 - 5.81 MIL/uL   Hemoglobin 15.6 13.0 - 17.0 g/dL   HCT 94.8 54.6 - 27.0 %   MCV 87.1 80.0 - 100.0 fL   MCH 30.5 26.0 - 34.0 pg   MCHC 35.1 30.0 - 36.0 g/dL   RDW 35.0 09.3 - 81.8 %   Platelets 266 150 - 400 K/uL   nRBC 0.0 0.0 - 0.2 %    Comment: Performed at [facility], [address] [place], [place] [address]  Urine Drug Screen, Qualitative     Status: None   Collection Time: 02/28/20  2:29 PM  Result Value Ref Range   Tricyclic, Ur Screen NONE DETECTED NONE DETECTED   Amphetamines, Ur Screen NONE DETECTED NONE DETECTED   MDMA (Ecstasy)Ur Screen NONE DETECTED NONE DETECTED   Cocaine Metabolite,Ur [place] NONE DETECTED NONE DETECTED   Opiate, Ur Screen NONE DETECTED NONE DETECTED   Phencyclidine (PCP) Ur S NONE DETECTED NONE DETECTED   Cannabinoid 50 Ng, Ur [place] NONE DETECTED NONE DETECTED   Barbiturates, Ur Screen NONE DETECTED NONE DETECTED   Benzodiazepine, Ur Scrn NONE DETECTED NONE DETECTED   Methadone Scn, Ur NONE DETECTED NONE DETECTED    Comment: (NOTE) Tricyclics + metabolites, urine    Cutoff 1000 ng/mL Amphetamines + metabolites, urine  Cutoff 1000 ng/mL MDMA (Ecstasy), urine              Cutoff 500 ng/mL Cocaine Metabolite, urine          Cutoff 300 ng/mL Opiate + metabolites, urine        Cutoff 300 ng/mL Phencyclidine (PCP), urine         Cutoff 25 ng/mL Cannabinoid, urine  Cutoff 50 ng/mL Barbiturates + metabolites, urine  Cutoff 200 ng/mL Benzodiazepine, urine              Cutoff 200 ng/mL Methadone, urine                   Cutoff  300 ng/mL The urine drug screen provides only a preliminary, unconfirmed analytical test result and should not be used for non-medical purposes. Clinical consideration and professional judgment should be applied to any positive drug screen result due to possible interfering substances. A more specific alternate chemical method must be used in order to obtain a confirmed analytical result. Gas chromatography / mass spectrometry (GC/MS) is the preferred confirmat ory method. Performed at [facility], [address] [place], [place] [address]   SARS Coronavirus 2 by RT PCR (hospital order, performed in [facility] hospital lab) Nasopharyngeal Nasopharyngeal Swab     Status: None   Collection Time: 02/28/20  4:02 PM   Specimen: Nasopharyngeal Swab  Result Value Ref Range   SARS Coronavirus 2 NEGATIVE NEGATIVE    Comment: (NOTE) SARS-CoV-2 target nucleic acids are NOT DETECTED. The SARS-CoV-2 RNA is generally detectable in upper and lower respiratory specimens during the acute phase of infection. The lowest concentration of SARS-CoV-2 viral copies this assay can detect is 250 copies / mL. A negative result does not preclude SARS-CoV-2 infection and should not be used as the sole basis for treatment or other patient management decisions.  A negative result may occur with improper specimen collection / handling, submission of specimen other than nasopharyngeal swab, presence of viral mutation(s) within the areas targeted by this assay, and inadequate number of viral copies (<250 copies / mL). A negative result must be combined with clinical observations, patient history, and epidemiological information. Fact Sheet for Patients:   BoilerBrush.com.cy Fact Sheet for Healthcare Providers: https://pope.com/ This test is not yet approved or cleared  by the [place] FDA and has been authorized for detection and/or diagnosis of  SARS-CoV-2 by FDA under an Emergency Use Authorization (EUA).  This EUA will remain in effect (meaning this test can be used) for the duration of the COVID-19 declaration under Section 564(b)(1) of the Act, 21 U.S.C. section 360bbb-3(b)(1), unless the authorization is terminated or revoked sooner. Performed at [facility], [address] [place], [place] [address]     Current Facility-Administered Medications  Medication Dose Route Frequency Provider Last Rate Last Admin  . traZODone (DESYREL) tablet 50 mg  50 mg Oral QHS PRN Gillermo Murdoch, NP       Current Outpatient Medications  Medication Sig Dispense Refill  . benztropine (COGENTIN) 0.5 MG tablet Take 0.5 tablets (0.25 mg total) by mouth daily. 30 tablet 1  . D3 SUPER STRENGTH 50 MCG (2000 UT) CAPS Take 1 capsule (2,000 Units total) by mouth daily. 30 capsule 1  . risperiDONE (RISPERDAL) 4 MG tablet Take 1 tablet (4 mg total) by mouth daily. 30 tablet 1  . traZODone (DESYREL) 100 MG tablet Take 1 tablet (100 mg total) by mouth at bedtime as needed for sleep. 30 tablet 1    Musculoskeletal: Strength & Muscle Tone: within normal limits Gait & Station: normal Patient leans: N/A  Psychiatric Specialty Exam: Physical Exam  Vitals reviewed. Skin: Skin is warm.  Psychiatric: He has a normal mood and affect. His behavior is normal.    Review of Systems  Psychiatric/Behavioral: Negative for suicidal ideas. The patient is nervous/anxious.   All other systems reviewed  and are negative.   Blood pressure 100/62, pulse 100, temperature 98.6 F (37 C), temperature source Oral, resp. rate 18, height 5\' 9"  (1.753 m), weight 86.2 kg, SpO2 96 %.Body mass index is 28.06 kg/m.  General Appearance: Casual  Eye Contact:  Good  Speech:  Clear and Coherent  Volume:  Normal  Mood:  Anxious and Depressed  Affect:  Appropriate  Thought Process:  Coherent  Orientation:  Full (Time, Place, and Person)  Thought Content:   Logical  Suicidal Thoughts:  Yes.  without intent/plan  Homicidal Thoughts:  No  Memory:  Immediate;   Fair Remote;   Fair  Judgement:  Fair  Insight:  Fair  Psychomotor Activity:  Normal  Concentration:  Concentration: Fair  Recall:  [employer] of Knowledge:  Fair  Language:  Fair  Akathisia:  No  Handed:  Right  AIMS (if indicated):     Assets:  Communication Skills Desire for Improvement Social Support  ADL's:  Intact  Cognition:  WNL  Sleep:    Well   The patient can be discharge once TTS connect with the patient ACT team [facility] ACT team).  Disposition: No evidence of imminent risk to self or others at present.   Patient does not meet criteria for psychiatric inpatient admission. Supportive therapy provided about ongoing stressors. Refer to IOP. Discussed crisis plan, support from social network, calling 911, coming to the Emergency Department, and calling Suicide Hotline.  Caroline Sauger, NP 02/29/2020 12:42 AM

## 2020-02-29 NOTE — ED Provider Notes (Signed)
Emergency Medicine Observation Re-evaluation Note  Clinton Hart is a 39 y.o. male, seen on rounds today.  Pt initially presented to the ED for complaints of Psychiatric Evaluation Currently, the patient is resting after eating snack.  Physical Exam  BP 100/62 (BP Location: Right Arm)   Pulse 100   Temp 98.6 F (37 C) (Oral)   Resp 18   Ht 5\' 9"  (1.753 m)   Wt 86.2 kg   SpO2 96%   BMI 28.06 kg/m  Physical Exam   Constitutional: Resting comfortably. Eyes: Conjunctivae are normal. Head: Atraumatic. Nose: No congestion/rhinnorhea. Mouth/Throat: Mucous membranes are moist. Neck: Normal ROM Cardiovascular: No cyanosis noted. Respiratory: Normal respiratory effort. Gastrointestinal: Non-distended. Genitourinary: deferred Musculoskeletal: No lower extremity tenderness nor edema. Neurologic:  Normal speech and language. No gross focal neurologic deficits are appreciated. Skin:  Skin is warm, dry and intact. No rash noted.    ED Course / MDM  EKG:    I have reviewed the labs performed to date as well as medications administered while in observation.  Recent changes in the last 24 hours include patient moved to [facility]. Plan  Current plan is for voluntary admission. Patient is not under full IVC at this time.   [facility], MD 02/29/20 [phone]

## 2020-02-29 NOTE — ED Notes (Signed)
Patient given a snack pack

## 2020-02-29 NOTE — ED Notes (Signed)
Pt requesting  "When am I going to be ready to go - I have been waiting and the lady last night says someone is calling my ACTT team and then I am going home this morning" - I called and spoke with TTS -Aqua and she reports that the psychiatrist is going to reassess him today   Informed the pt about this new plan of care and he verbalizes agreement to wait to talk with the MD  Pt is voluntary   Verbalizes SI

## 2020-02-29 NOTE — Progress Notes (Signed)
[facility] MD Progress Note  02/29/2020 4:08 PM Clinton Hart  MRN:  [id number] Subjective:   I will stay again for admission    Principal Problem: worsening Si and plans but ambivalent and does not know what to do  Unclear safety margin despite ACT team following needs overhall of medications  Complicated bereavement  Adjustment disorder mixed   Diagnosis: Active Problems:   Schizoaffective disorder (HCC)  Total Time spent with patient: 30-40 Past Psychiatric History:   History of S/A disorder    Past Medical History:  Past Medical History:  Diagnosis Date   Schizophrenia (HCC)    History reviewed. No pertinent surgical history. Family History: History reviewed. No pertinent family history. Family Psychiatric  History: both parents passed away ---both with depression and anxiety  Social History:  Lives alone but remains in dysfunctional state despite ACT team involvement .  Needs depot injectables and more close follow up   He is at risk  Recently came and was discharged but if discharged again will be revolving.  Unclear safety margin    Social History   Substance and Sexual Activity  Alcohol Use Never     Social History   Substance and Sexual Activity  Drug Use Never    Social History   Socioeconomic History   Marital status: Single    Spouse name: Not on file   Number of children: Not on file   Years of education: Not on file   Highest education level: Not on file  Occupational History   Not on file  Tobacco Use   Smoking status: Current Every Day Smoker   Smokeless tobacco: Never Used  Substance and Sexual Activity   Alcohol use: Never   Drug use: Never   Sexual activity: Not on file  Other Topics Concern   Not on file  Social History Narrative   Not on file   Social Determinants of Health   Financial Resource Strain:    Difficulty of Paying Living Expenses:   Food Insecurity:    Worried About Programme researcher, broadcasting/film/video in the Last Year:     Barista in the Last Year:   Transportation Needs:    Freight forwarder (Medical):    Lack of Transportation (Non-Medical):   Physical Activity:    Days of Exercise per Week:    Minutes of Exercise per Session:   Stress:    Feeling of Stress :   Social Connections:    Frequency of Communication with Friends and Family:    Frequency of Social Gatherings with Friends and Family:    Attends Religious Services:    Active Member of Clubs or Organizations:    Attends Banker Meetings:    Marital Status:    Additional Social History:    Pain Medications: see mar Prescriptions: see mar Over the Counter: see mar History of alcohol / drug use?: No history of alcohol / drug abuse                    Sleep: difficulties with all three cycles   Appetite:  Fair   Current Medications: Current Facility-Administered Medications  Medication Dose Route Frequency Provider Last Rate Last Admin   traZODone (DESYREL) tablet 50 mg  50 mg Oral QHS PRN Gillermo Murdoch, NP       Current Outpatient Medications  Medication Sig Dispense Refill   benztropine (COGENTIN) 0.5 MG tablet Take 0.5 tablets (0.25 mg total) by mouth daily.  30 tablet 1   D3 SUPER STRENGTH 50 MCG (2000 UT) CAPS Take 1 capsule (2,000 Units total) by mouth daily. 30 capsule 1   risperiDONE (RISPERDAL) 4 MG tablet Take 1 tablet (4 mg total) by mouth daily. 30 tablet 1   traZODone (DESYREL) 100 MG tablet Take 1 tablet (100 mg total) by mouth at bedtime as needed for sleep. 30 tablet 1    Lab Results:  Results for orders placed or performed during the hospital encounter of 02/28/20 (from the past 48 hour(s))  Comprehensive metabolic panel     Status: Abnormal   Collection Time: 02/28/20  2:29 PM  Result Value Ref Range   Sodium 136 135 - 145 mmol/L   Potassium 3.7 3.5 - 5.1 mmol/L   Chloride 100 98 - 111 mmol/L   CO2 28 22 - 32 mmol/L   Glucose, Bld 96 70 - 99 mg/dL     Comment: Glucose reference range applies only to samples taken after fasting for at least 8 hours.   BUN 9 6 - 20 mg/dL   Creatinine, Ser 1.50 0.61 - 1.24 mg/dL   Calcium 9.3 8.9 - 56.9 mg/dL   Total Protein 8.1 6.5 - 8.1 g/dL   Albumin 4.3 3.5 - 5.0 g/dL   AST 29 15 - 41 U/L   ALT 50 (H) 0 - 44 U/L   Alkaline Phosphatase 49 38 - 126 U/L   Total Bilirubin 0.8 0.3 - 1.2 mg/dL   GFR calc non Af Amer >60 >60 mL/min   GFR calc Af Amer >60 >60 mL/min   Anion gap 8 5 - 15    Comment: Performed at [facility], [address], [place], [place] [address]  Ethanol     Status: None   Collection Time: 02/28/20  2:29 PM  Result Value Ref Range   Alcohol, Ethyl (B) <10 <10 mg/dL    Comment: (NOTE) Lowest detectable limit for serum alcohol is 10 mg/dL. For medical purposes only. Performed at [facility], [address] [place], [place] [address]   Salicylate level     Status: Abnormal   Collection Time: 02/28/20  2:29 PM  Result Value Ref Range   Salicylate Lvl <7.0 (L) 7.0 - 30.0 mg/dL    Comment: Performed at [facility], [address] [place], [place] [address]  Acetaminophen level     Status: Abnormal   Collection Time: 02/28/20  2:29 PM  Result Value Ref Range   Acetaminophen (Tylenol), Serum <10 (L) 10 - 30 ug/mL    Comment: (NOTE) Therapeutic concentrations vary significantly. A range of 10-30 ug/mL  may be an effective concentration for many patients. However, some  are best treated at concentrations outside of this range. Acetaminophen concentrations >150 ug/mL at 4 hours after ingestion  and >50 ug/mL at 12 hours after ingestion are often associated with  toxic reactions. Performed at [facility], [address] [place], [place] [address]   cbc     Status: None   Collection Time: 02/28/20  2:29 PM  Result Value Ref Range   WBC 6.5 4.0 - 10.5 K/uL   RBC 5.11 4.22 - 5.81 MIL/uL   Hemoglobin 15.6 13.0 - 17.0 g/dL   HCT 54.4 39 -  52 %   MCV 87.1 80.0 - 100.0 fL   MCH 30.5 26.0 - 34.0 pg   MCHC 35.1 30.0 - 36.0 g/dL   RDW 92.0 10.0 - 71.2 %   Platelets 266 150 -  400 K/uL   nRBC 0.0 0.0 - 0.2 %    Comment: Performed at [facility], [address] [place], [place] [address]  Urine Drug Screen, Qualitative     Status: None   Collection Time: 02/28/20  2:29 PM  Result Value Ref Range   Tricyclic, Ur Screen NONE DETECTED NONE DETECTED   Amphetamines, Ur Screen NONE DETECTED NONE DETECTED   MDMA (Ecstasy)Ur Screen NONE DETECTED NONE DETECTED   Cocaine Metabolite,Ur Eldred NONE DETECTED NONE DETECTED   Opiate, Ur Screen NONE DETECTED NONE DETECTED   Phencyclidine (PCP) Ur S NONE DETECTED NONE DETECTED   Cannabinoid 50 Ng, Ur Halsey NONE DETECTED NONE DETECTED   Barbiturates, Ur Screen NONE DETECTED NONE DETECTED   Benzodiazepine, Ur Scrn NONE DETECTED NONE DETECTED   Methadone Scn, Ur NONE DETECTED NONE DETECTED    Comment: (NOTE) Tricyclics + metabolites, urine    Cutoff 1000 ng/mL Amphetamines + metabolites, urine  Cutoff 1000 ng/mL MDMA (Ecstasy), urine              Cutoff 500 ng/mL Cocaine Metabolite, urine          Cutoff 300 ng/mL Opiate + metabolites, urine        Cutoff 300 ng/mL Phencyclidine (PCP), urine         Cutoff 25 ng/mL Cannabinoid, urine                 Cutoff 50 ng/mL Barbiturates + metabolites, urine  Cutoff 200 ng/mL Benzodiazepine, urine              Cutoff 200 ng/mL Methadone, urine                   Cutoff 300 ng/mL The urine drug screen provides only a preliminary, unconfirmed analytical test result and should not be used for non-medical purposes. Clinical consideration and professional judgment should be applied to any positive drug screen result due to possible interfering substances. A more specific alternate chemical method must be used in order to obtain a confirmed analytical result. Gas chromatography / mass spectrometry (GC/MS) is the preferred confirmat ory  method. Performed at [facility], [address] [place], [place] [address]   SARS Coronavirus 2 by RT PCR (hospital order, performed in [facility] hospital lab) Nasopharyngeal Nasopharyngeal Swab     Status: None   Collection Time: 02/28/20  4:02 PM   Specimen: Nasopharyngeal Swab  Result Value Ref Range   SARS Coronavirus 2 NEGATIVE NEGATIVE    Comment: (NOTE) SARS-CoV-2 target nucleic acids are NOT DETECTED. The SARS-CoV-2 RNA is generally detectable in upper and lower respiratory specimens during the acute phase of infection. The lowest concentration of SARS-CoV-2 viral copies this assay can detect is 250 copies / mL. A negative result does not preclude SARS-CoV-2 infection and should not be used as the sole basis for treatment or other patient management decisions.  A negative result may occur with improper specimen collection / handling, submission of specimen other than nasopharyngeal swab, presence of viral mutation(s) within the areas targeted by this assay, and inadequate number of viral copies (<250 copies / mL). A negative result must be combined with clinical observations, patient history, and epidemiological information. Fact Sheet for Patients:   BoilerBrush.com.cy Fact Sheet for Healthcare Providers: https://pope.com/ This test is not yet approved or cleared  by the [place] FDA and has been authorized for detection and/or diagnosis of SARS-CoV-2 by FDA under an Emergency Use Authorization (EUA).  This  EUA will remain in effect (meaning this test can be used) for the duration of the COVID-19 declaration under Section 564(b)(1) of the Act, 21 U.S.C. section 360bbb-3(b)(1), unless the authorization is terminated or revoked sooner. Performed at [facility], [address] [place], [place] [address]     Blood Alcohol level:  Lab Results  Component Value Date   [facility] <10 02/28/2020   ETH  <10 21/22/4825    Metabolic Disorder Labs: Lab Results  Component Value Date   HGBA1C 5.5 07/01/2014   No results found for: PROLACTIN Lab Results  Component Value Date   CHOL 180 02/22/2020   TRIG 185 (H) 02/22/2020   HDL 44 02/22/2020   CHOLHDL 4.1 02/22/2020   VLDL 37 02/22/2020   LDLCALC 99 02/22/2020    Physical Findings: AIMS:  , ,  has some early TD movements by observatoni ,  ,    CIWA:    none now COWS:   none now    Musculoskeletal: Strength & Muscle Tone: normal  Gait & Station: normal but pacing  Patient leans: n/a   Psychiatric Specialty Exam: Physical Exam  Per ED   Review of Systems  8 systems negative   Blood pressure 100/62, pulse 100, temperature 98.6 F (37 C), temperature source Oral, resp. rate 18, height 5\' 9"  (1.753 m), weight 86.2 kg, SpO2 96 %.Body mass index is 28.06 kg/m.      Mental Status   Alert cooperative --strange and odd  Appearance somewhat unkept  Oriented to person place and time  Rapport fair --answers questions  Concentration and attention okay  Consciousness not clouded or fluctuant Movements --possible early TD movements around mouth fingers Speech --slightly pressured anxious  Mood --depressed /anxious  Affect slightly constricted  Thought content --ambivalent, depressive themes bereavement issues  Bereavement themes  Thought process ---vague, illogical with stress ambivalent  Memory remote recent immediate through general questions  Fund of knowledge intelligence below average  Judgement insight fair to poor Reliability fair to poor   SI and HI--SI and plans waxes and wanes No clear margin of safety, recently was admitted                                                                Treatment Plan Summary:  Patient with SI plan risk factors   Male,  S/A issue, lives alone, non compliance / complex bereavement over parents death --no major supports and activities  ----unclear ACT team depth of involvement --paranoia at baseline  Recent admission and discharge   Unclear med effects yet    Admitted to our unit or to outside unit --on voluntary status for now             Eulas Post, MD 02/29/2020, 4:08 PM

## 2020-02-29 NOTE — BH Assessment (Signed)
Referral information for Psychiatric Hospitalization faxed to:   [facility] ([phone]-or- [phone]),    [facility] ([phone]),    [facility] ([phone] -or- [phone]),    [facility] [phone] -or- [phone])  [phone]   [facility] ([phone]---[phone]---[phone]),    [facility] ([phone] or [phone])   [facility] ([phone] or [phone])   [facility] ([phone] or [phone]),    [facility] ([phone]). 

## 2020-02-29 NOTE — ED Notes (Signed)
He continues to walk/pace in circles around the unit  NAD observed  No verbalized needs or concerns at this time

## 2020-03-01 NOTE — ED Notes (Signed)
Report called and given to receiving nurse  Irine Seal RN, at [facility]. [facility] called awaiting transfer. Patient sitting in room having breakfast and awaiting his ride to [facility]. Safety maintained. Will continue to monitor.

## 2020-03-01 NOTE — ED Notes (Signed)
Patient up early pacing in day room,. Aware that he will be transfer to lower level BHU sometime this morning. Patient calm and cooperative. Safety maintained will monitor.

## 2020-03-01 NOTE — ED Notes (Signed)
Safe transport to pick up patient. Patient on the way to [facility].

## 2020-03-01 NOTE — ED Notes (Signed)
SAFE  TRANSPORT  CALLED  TO  TAKE  PT  TO  [facility]  INFORMED  JEANNETTE  RN

## 2020-03-01 NOTE — ED Provider Notes (Signed)
Emergency Medicine Observation Re-evaluation Note  Clinton Hart is a 39 y.o. male, seen on rounds today. Patient presented to the emergency department for depression and suicidal ideation. Patient has been accepted to [facility]. I have physically seen and spoken to the patient this morning. He states he is feeling well but continues to feel depressed and continues to feel the need for continued psychiatric care. Patient was able to eat this morning. He is walking around the behavioral health unit without complication or issue. Denies any medical complaints to myself this morning.  Physical Exam  BP 118/74 (BP Location: Right Arm)   Pulse 78   Temp 97.6 F (36.4 C) (Oral)   Resp 18   Ht 5\' 9"  (1.753 m)   Wt 86.2 kg   SpO2 100%   BMI 28.06 kg/m   General: Patient is awake alert, no acute distress. Neurological: Patient ambulating well moving all extremities, no gross deficit. Psychiatric: Patient continues to feel depressed although seems motivated for continued psychiatric care   ED Course / MDM   Lab work has been reviewed by myself, largely nonrevealing. Covid test is negative. Urine drug screen negative. Patient appears to be doing well.  Plan  Patient will be going to [facility] today. Has been accepted by [facility]. [facility]. We will transfer once transportation has arrived.   Loyola Mast, MD 03/01/20 1019

## 2021-07-11 ENCOUNTER — Ambulatory Visit: Payer: Self-pay | Admitting: Family Medicine

## 2021-11-04 ENCOUNTER — Other Ambulatory Visit: Payer: Self-pay

## 2021-11-04 ENCOUNTER — Emergency Department
Admission: EM | Admit: 2021-11-04 | Discharge: 2021-11-04 | Disposition: A | Discharge status: Home / Self Care | Payer: Medicare (Managed Care) | Attending: Emergency Medicine | Admitting: Emergency Medicine

## 2021-11-04 DIAGNOSIS — Z5321 Procedure and treatment not carried out due to patient leaving prior to being seen by health care provider: Secondary | ICD-10-CM | POA: Insufficient documentation

## 2021-11-04 DIAGNOSIS — F22 Delusional disorders: Secondary | ICD-10-CM | POA: Diagnosis not present

## 2021-11-04 LAB — CBC
HCT: 47.9 % (ref 39.0–52.0)
Hemoglobin: 16.6 g/dL (ref 13.0–17.0)
MCH: 29.7 pg (ref 26.0–34.0)
MCHC: 34.7 g/dL (ref 30.0–36.0)
MCV: 85.7 fL (ref 80.0–100.0)
Platelets: 270 10*3/uL (ref 150–400)
RBC: 5.59 MIL/uL (ref 4.22–5.81)
RDW: 12.7 % (ref 11.5–15.5)
WBC: 6.9 10*3/uL (ref 4.0–10.5)
nRBC: 0 % (ref 0.0–0.2)

## 2021-11-04 LAB — ETHANOL: Alcohol, Ethyl (B): <10 mg/dL (ref <10)

## 2021-11-04 LAB — URINE DRUG SCREEN, QUALITATIVE (ARMC ONLY)
Amphetamines, Ur Screen: NOT DETECTED
Barbiturates, Ur Screen: NOT DETECTED
Benzodiazepine, Ur Scrn: NOT DETECTED
Cannabinoid 50 Ng, Ur ~~LOC~~: NOT DETECTED
Cocaine Metabolite,Ur ~~LOC~~: NOT DETECTED
MDMA (Ecstasy)Ur Screen: NOT DETECTED
Methadone Scn, Ur: NOT DETECTED
Opiate, Ur Screen: NOT DETECTED
Phencyclidine (PCP) Ur S: NOT DETECTED
Tricyclic, Ur Screen: NOT DETECTED

## 2021-11-04 LAB — COMPREHENSIVE METABOLIC PANEL
ALT: 25 U/L (ref 0–44)
AST: 23 U/L (ref 15–41)
Albumin: 4.5 g/dL (ref 3.5–5.0)
Alkaline Phosphatase: 64 U/L (ref 38–126)
Anion gap: 6 (ref 5–15)
BUN: 13 mg/dL (ref 6–20)
CO2: 27 mmol/L (ref 22–32)
Calcium: 9.4 mg/dL (ref 8.9–10.3)
Chloride: 102 mmol/L (ref 98–111)
Creatinine, Ser: 1.04 mg/dL (ref 0.61–1.24)
GFR, Estimated: >60 mL/min (ref >60)
Glucose, Bld: 131 mg/dL — ABNORMAL HIGH (ref 70–99)
Potassium: 3.6 mmol/L (ref 3.5–5.1)
Sodium: 135 mmol/L (ref 135–145)
Total Bilirubin: 0.5 mg/dL (ref 0.3–1.2)
Total Protein: 8.4 g/dL — ABNORMAL HIGH (ref 6.5–8.1)

## 2021-11-04 LAB — ACETAMINOPHEN LEVEL: Acetaminophen (Tylenol), Serum: <10 ug/mL — ABNORMAL LOW (ref 10–30)

## 2021-11-04 LAB — SALICYLATE LEVEL: Salicylate Lvl: <7 mg/dL — ABNORMAL LOW (ref 7.0–30.0)

## 2021-11-04 NOTE — ED Notes (Signed)
Pt in ED stated "I'm a schizophrenic, I was wondering if you have a bed for the night."  Pt was agreeable to speak to the ED MD.  Pt denies all SI/HI stated he has no current auditory or visual hallucinations however did say "sometimes I'm just not thinking right"

## 2021-11-04 NOTE — ED Notes (Signed)
Pt requesting a room and a lunch tray.  Staff explained that there are no available rooms currently and that we would request a tray from the kitchen.

## 2021-11-04 NOTE — ED Triage Notes (Signed)
Pt states he is having increased paranoia, states he has a hx of schizophrenia, denies HI/SI. States he has been taking his meds as Rx, has an ACT team. Denies having any auditory or visual hallucinations

## 2021-11-04 NOTE — ED Notes (Addendum)
Pt lwbs 

## 2021-11-04 NOTE — ED Provider Notes (Signed)
Patient eloped prior to evaluation   Merwyn Katos, MD 11/04/21 [phone]

## 2021-11-20 ENCOUNTER — Emergency Department
Admission: EM | Admit: 2021-11-20 | Discharge: 2021-11-20 | Disposition: A | Discharge status: Home / Self Care | Payer: Medicare HMO | Attending: Emergency Medicine | Admitting: Emergency Medicine

## 2021-11-20 ENCOUNTER — Emergency Department: Payer: Medicare HMO

## 2021-11-20 ENCOUNTER — Other Ambulatory Visit: Payer: Self-pay

## 2021-11-20 DIAGNOSIS — J45909 Unspecified asthma, uncomplicated: Secondary | ICD-10-CM | POA: Insufficient documentation

## 2021-11-20 DIAGNOSIS — R0602 Shortness of breath: Secondary | ICD-10-CM | POA: Diagnosis present

## 2021-11-20 DIAGNOSIS — J9801 Acute bronchospasm: Secondary | ICD-10-CM | POA: Insufficient documentation

## 2021-11-20 LAB — BASIC METABOLIC PANEL
Anion gap: 8 (ref 5–15)
BUN: 10 mg/dL (ref 6–20)
CO2: 25 mmol/L (ref 22–32)
Calcium: 8.8 mg/dL — ABNORMAL LOW (ref 8.9–10.3)
Chloride: 103 mmol/L (ref 98–111)
Creatinine, Ser: 1.02 mg/dL (ref 0.61–1.24)
GFR, Estimated: >60 mL/min (ref >60)
Glucose, Bld: 130 mg/dL — ABNORMAL HIGH (ref 70–99)
Potassium: 3.4 mmol/L — ABNORMAL LOW (ref 3.5–5.1)
Sodium: 136 mmol/L (ref 135–145)

## 2021-11-20 LAB — CBC
HCT: 42.7 % (ref 39.0–52.0)
Hemoglobin: 14.6 g/dL (ref 13.0–17.0)
MCH: 29.7 pg (ref 26.0–34.0)
MCHC: 34.2 g/dL (ref 30.0–36.0)
MCV: 86.8 fL (ref 80.0–100.0)
Platelets: 252 10*3/uL (ref 150–400)
RBC: 4.92 MIL/uL (ref 4.22–5.81)
RDW: 12.9 % (ref 11.5–15.5)
WBC: 5.4 10*3/uL (ref 4.0–10.5)
nRBC: 0 % (ref 0.0–0.2)

## 2021-11-20 MED ORDER — ALBUTEROL SULFATE HFA 108 (90 BASE) MCG/ACT IN AERS
1.0000 | INHALATION_SPRAY | Freq: Four times a day (QID) | RESPIRATORY_TRACT | Status: DC | PRN
  Filled 2021-11-20 (×2): qty 6.7

## 2021-11-20 MED ORDER — ALBUTEROL SULFATE HFA 108 (90 BASE) MCG/ACT IN AERS
2.0000 | INHALATION_SPRAY | RESPIRATORY_TRACT | Status: DC | PRN
  Filled 2021-11-20: qty 6.7

## 2021-11-20 NOTE — ED Triage Notes (Signed)
Pt to ED via POV from home. Pt states SOB started PTA. Pt denies any exertion with onset. Pt denies CP, cough, fever, N/V/D. Pt current everyday smoker.  ?

## 2021-11-20 NOTE — Discharge Instructions (Signed)
Please follow-up with your primary doctor. ? ?Return to the emergency room right away if you develop increased shortness of breath, fever, difficulty breathing chest pain or other concerns arise ?

## 2021-11-20 NOTE — ED Provider Notes (Signed)
? ?[facility] ?Provider Note ? ? ? Event Date/Time  ? First MD Initiated Contact with Patient 11/20/21 2209   ?  (approximate) ? ? ?History  ? ?Shortness of Breath ? ? ?HPI ? ?Clinton Hart is a 41 y.o. male   past medical history of schizophrenia, reports he had asthma as a child ? ?Today around 1 or so o'clock he reports he started to notice he felt slightly short of breath.  Not really sure why, it lasted for maybe an hour or 2 and he came to the ER.  Reports all of his symptoms are completely gone now and he feels totally normal. ? ?Reports that symptoms went away on their own after about an hour he felt like he was wheezing slightly and just slightly short of breath.  He had no chest pain no nausea no vomiting no fevers no chills.  No cough.  He does smoke, but his symptoms he reports are gone now ? ?He was resting when symptoms started and thinks it lasted about an hour to 2 hours and went away ? ?  ?Denies history of any blood clots, does not take any hormones, no leg swelling no leg pain no long trips or travel no recent surgeries or injuries. ? ?Physical Exam  ? ?Triage Vital Signs: ?ED Triage Vitals  ?Enc Vitals Group  ?   BP 11/20/21 1537 96/76  ?   Pulse Rate 11/20/21 1537 99  ?   Resp 11/20/21 1537 18  ?   Temp 11/20/21 1537 98.4 ?F (36.9 ?C)  ?   Temp Source 11/20/21 1537 Oral  ?   SpO2 11/20/21 1537 95 %  ?   Weight 11/20/21 1538 200 lb (90.7 kg)  ?   Height 11/20/21 1538 5\' 9"  (1.753 m)  ?   Head Circumference --   ?   Peak Flow --   ?   Pain Score 11/20/21 1538 0  ?   Pain Loc --   ?   Pain Edu? --   ?   Excl. in [place]? --   ? ? ?Most recent vital signs: ?Vitals:  ? 11/20/21 1537  ?BP: 96/76  ?Pulse: 99  ?Resp: 18  ?Temp: 98.4 ?F (36.9 ?C)  ?SpO2: 95%  ? ? ? ?General: Awake, no distress.  Pleasant, seated in the bed without distress.  Speaks full clear sentences ?CV:  Good peripheral perfusion.  Normal heart tones regular rate and rhythm no murmurs ?Resp:  Normal effort.   Clear lung sounds.  No wheezing.  Patient reports of note that he has no symptoms now feels completely normal ?Abd:  No distention.  ?Other:  No edema lower extremities bilateral.  No calf tenderness or pain.  No venous cords or congestion ? ? ?ED Results / Procedures / Treatments  ? ?Labs ?(all labs ordered are listed, but only abnormal results are displayed) ?Labs Reviewed  ?BASIC METABOLIC PANEL - Abnormal; Notable for the following components:  ?    Result Value  ? Potassium 3.4 (*)   ? Glucose, Bld 130 (*)   ? Calcium 8.8 (*)   ? All other components within normal limits  ?CBC  ? ? ? ?EKG ? ?ED ECG REPORT ?Monika Salk, the attending physician, personally viewed and interpreted this ECG. ? ?Date: 11/20/2021 ?EKG Time: 1540 ?Rate: 100 ?Rhythm: normal sinus rhythm ?QRS Axis: normal ?Intervals: normal ?ST/T Wave abnormalities: normal ?Narrative Interpretation: no evidence of acute ischemia ? ? ? ?RADIOLOGY ? ? ? ?  Chest x-ray personally viewed and interpreted by me, negative for acute finding ? ? ? ?PROCEDURES: ? ?Critical Care performed: No ? ?Procedures ? ? ?MEDICATIONS ORDERED IN ED: ?Medications  ?albuterol (VENTOLIN HFA) 108 (90 Base) MCG/ACT inhaler 2 puff (has no administration in time range)  ?albuterol (VENTOLIN HFA) 108 (90 Base) MCG/ACT inhaler 1-2 puff (has no administration in time range)  ? ? ? ?IMPRESSION / MDM / ASSESSMENT AND PLAN / ED COURSE  ?I reviewed the triage vital signs and the nursing notes. ?             ?               ? ?Differential diagnosis includes, but is not limited to, bronchitis, mild reactive airway disease, pneumothorax, pneumonia viral illness etc.  Lacks infectious symptoms.  No chest pain.  No risk factors for pulmonary embolism.  Reports symptoms are brief lasted about an hour or less and completely resolved now.  No significant findings suggest ACS, although symptoms resolved.  He is a smoker, reports he felt like he had slight wheezing and slight shortness of breath now  resolved, will prescribe albuterol which she may use as needed should this be reactive airway disease or perhaps mild bronchitis.  Discussed careful return precautions. ? ? ?   ?Pulmonary Embolism Rule-out Criteria (PERC rule) ?  ??                     If YES to ANY of the following, the PERC rule is not satisfied and cannot be used to rule out PE in this patient (consider d-dimer or imaging depending on pre-test probability). ??                     If NO to ALL of the following, AND the clinician's pre-test probability is <15%, the [facility] rule is satisfied and there is no need for further workup (including no need to obtain a d-dimer) as the post-test probability of pulmonary embolism is <2%. ??                     Mnemonic is HAD CLOTS ?  ?H - hormone use (exogenous estrogen)      No. ?A - age > 14                                                 No. ?D - DVT/PE history                                      No. ?  ?C - coughing blood (hemoptysis)                 No. ?L - leg swelling, unilateral                             No. ?O - O2 Sat on Room Air < 95%                  No. ?T - tachycardia (HR noted 100 even today)                     No. ?S -  surgery or trauma, recent                      No. ?  ?Based on my evaluation of the patient, including application of this decision instrument, further testing to evaluate for pulmonary embolism is not indicated at this time.  ? ?Labs reviewed interpreted by me essentially normal metabolic panel except for very minimally reduced potassium and calcium.  Normal CBC. ? ?Clinical Course as of 11/20/21 2300  ?Thu Nov 20, 2021  ?2045 Some delay in evaluation of the patient now that she has reached main side bed due to arrival of a critical patient [MQ]  ?  ?Clinical Course User Index ?[MQ] Delman Kitten, MD  ? ?Return precautions and treatment recommendations and follow-up discussed with the patient who is agreeable with the plan. ? ?Patient reports she just recently got a new  primary care doctor and would be more than happy to follow-up with him.  He is resting comfortably awake alert no distress amenable with plan for discharge and return precautions ? ?FINAL CLINICAL IMPRESSION(S) / ED DIAGNOSES  ? ?Final diagnoses:  ?Bronchospasm, acute  ? ? ? ?Rx / DC Orders  ? ?ED Discharge Orders   ? ? None  ? ?  ? ? ? ?Note:  This document was prepared using Dragon voice recognition software and may include unintentional dictation errors. ?  Delman Kitten, MD ?11/20/21 2306 ? ?

## 2021-11-23 ENCOUNTER — Emergency Department
Admission: EM | Admit: 2021-11-23 | Discharge: 2021-11-23 | Disposition: A | Discharge status: Home / Self Care | Payer: Medicare HMO | Attending: Emergency Medicine | Admitting: Emergency Medicine

## 2021-11-23 ENCOUNTER — Other Ambulatory Visit: Payer: Self-pay

## 2021-11-23 DIAGNOSIS — R000 Tachycardia, unspecified: Secondary | ICD-10-CM | POA: Diagnosis not present

## 2021-11-23 DIAGNOSIS — R002 Palpitations: Secondary | ICD-10-CM | POA: Insufficient documentation

## 2021-11-23 DIAGNOSIS — J45909 Unspecified asthma, uncomplicated: Secondary | ICD-10-CM | POA: Insufficient documentation

## 2021-11-23 LAB — CBC WITH DIFFERENTIAL/PLATELET
Abs Immature Granulocytes: 0 K/uL (ref 0.00–0.07)
Basophils Absolute: 0 K/uL (ref 0.0–0.1)
Basophils Relative: 1 %
Eosinophils Absolute: 0.1 K/uL (ref 0.0–0.5)
Eosinophils Relative: 2 %
HCT: 44 % (ref 39.0–52.0)
Hemoglobin: 15.2 g/dL (ref 13.0–17.0)
Immature Granulocytes: 0 %
Lymphocytes Relative: 33 %
Lymphs Abs: 1.7 K/uL (ref 0.7–4.0)
MCH: 29.7 pg (ref 26.0–34.0)
MCHC: 34.5 g/dL (ref 30.0–36.0)
MCV: 86.1 fL (ref 80.0–100.0)
Monocytes Absolute: 0.6 K/uL (ref 0.1–1.0)
Monocytes Relative: 11 %
Neutro Abs: 2.7 K/uL (ref 1.7–7.7)
Neutrophils Relative %: 53 %
Platelets: 267 K/uL (ref 150–400)
RBC: 5.11 MIL/uL (ref 4.22–5.81)
RDW: 12.9 % (ref 11.5–15.5)
WBC: 5 K/uL (ref 4.0–10.5)
nRBC: 0 % (ref 0.0–0.2)

## 2021-11-23 LAB — BASIC METABOLIC PANEL
Anion gap: 7 (ref 5–15)
BUN: 12 mg/dL (ref 6–20)
CO2: 27 mmol/L (ref 22–32)
Calcium: 8.9 mg/dL (ref 8.9–10.3)
Chloride: 102 mmol/L (ref 98–111)
Creatinine, Ser: 1.08 mg/dL (ref 0.61–1.24)
GFR, Estimated: >60 mL/min (ref >60)
Glucose, Bld: 127 mg/dL — ABNORMAL HIGH (ref 70–99)
Potassium: 3.6 mmol/L (ref 3.5–5.1)
Sodium: 136 mmol/L (ref 135–145)

## 2021-11-23 LAB — TSH: TSH: 0.838 u[IU]/mL (ref 0.350–4.500)

## 2021-11-23 LAB — MAGNESIUM: Magnesium: 2.3 mg/dL (ref 1.7–2.4)

## 2021-11-23 NOTE — ED Notes (Signed)
Follow up pcp info provided all questions answered  

## 2021-11-23 NOTE — ED Triage Notes (Signed)
Started this am , none at this time , denies drugs, pt axox4,  ?

## 2021-11-23 NOTE — ED Provider Notes (Signed)
? ?[facility] ?Provider Note ? ? ? Event Date/Time  ? First MD Initiated Contact with Patient 11/23/21 [phone]   ?  (approximate) ? ? ?History  ? ?Palpitations (Started this am , none at this time ) ? ? ?HPI ? ?Clinton Hart is a 41 y.o. male with a past medical history of schizophrenia and pediatric asthma who presents via EMS for evaluation of palpitations.  Started this morning.  Palpitations lasted 30 minutes and resolved prior to arrival.  Patient was not doing anything particularly stressful or exertional when it began.  He denies any other recent or associated sick symptoms including fevers, chest pain, shortness of breath, cough, headache, earache, sore throat, nausea, vomiting, diarrhea, urinary symptoms, abdominal pain, back pain rash or extremity pain.  No recent falls or injuries.  Patient denies any illicit drug use.  He does endorse tobacco abuse and states he took a couple sips of liquor last night but otherwise is not a regular heavy drinker.  States he is taking his schizophrenia medications and feels they are helping him.  Denies any other acute concerns at this time. ? ?  ?Past Medical History:  ?Diagnosis Date  ? Schizophrenia (HCC)   ? ? ? ?Physical Exam  ?Triage Vital Signs: ?ED Triage Vitals  ?Enc Vitals Group  ?   BP   ?   Pulse   ?   Resp   ?   Temp   ?   Temp src   ?   SpO2   ?   Weight   ?   Height   ?   Head Circumference   ?   Peak Flow   ?   Pain Score   ?   Pain Loc   ?   Pain Edu?   ?   Excl. in GC?   ? ? ?Most recent vital signs: ?Vitals:  ? 11/23/21 0834  ?BP: 120/84  ?Pulse: (!) 105  ?Resp: 17  ?Temp: 98.4 ?F (36.9 ?C)  ?SpO2: 97%  ? ? ?General: Awake, no distress.  ?CV:  Good peripheral perfusion.  No murmurs rubs or gallops.  2+ radial pulses. ?Resp:  Normal effort.  Clear bilaterally.  No increased effort. ?Abd:  No distention.  Soft. ?Other:   ? ? ?ED Results / Procedures / Treatments  ?Labs ?(all labs ordered are listed, but only abnormal results are  displayed) ?Labs Reviewed  ?BASIC METABOLIC PANEL - Abnormal; Notable for the following components:  ?    Result Value  ? Glucose, Bld 127 (*)   ? All other components within normal limits  ?CBC WITH DIFFERENTIAL/PLATELET  ?MAGNESIUM  ?TSH  ? ? ? ?EKG ? ?ECG is remarkable for sinus tachycardia with a ventricular rate of 107, normal axis, unremarkable intervals without evidence of acute ischemia or significant arrhythmia. ? ? ?RADIOLOGY ? ? ? ? ?PROCEDURES: ? ?Critical Care performed: No ? ?.1-3 Lead EKG Interpretation ?Performed by: Gilles Chiquito, MD ?Authorized by: Gilles Chiquito, MD  ? ?  Interpretation: normal   ?  ECG rate assessment: normal   ?  Rhythm: sinus rhythm   ?  Ectopy: none   ?  Conduction: normal   ? ?The patient is on the cardiac monitor to evaluate for evidence of arrhythmia and/or significant heart rate changes. ? ? ?MEDICATIONS ORDERED IN ED: ?Medications - No data to display ? ? ?IMPRESSION / MDM / ASSESSMENT AND PLAN / ED COURSE  ?I reviewed the triage  vital signs and the nursing notes. ?             ?               ? ?Differential diagnosis includes, but is not limited to paroxysmal arrhythmia, metabolic derangements, and anemia with lower suspicion based on patient's history and exam for trauma, PE, dissection, ACS, acute infectious process or significant withdrawal of his possible he is feeling effects of having some alcohol last night. ? ?ECG is remarkable for sinus tachycardia with a ventricular rate of 107, normal axis, unremarkable intervals without evidence of acute ischemia or significant arrhythmia. ? ?CBC without leukocytosis or acute anemia.  BMP without any significant electrolyte or metabolic derangements.  Magnesium and TSH are within normal limits.  Overall unclear precise etiology of patient's brief resolved palpitations experienced earlier today although given stable vitals with heart rates in the 90s on several reassessments and myself patient denying any symptoms and  low suspicion for immediate life-threatening process I think he is stable for discharge with outpatient follow-up and continue evaluation as indicated.  He is denying any other acute concerns at this time.  Discharged in stable condition.  Strict return precautions advised and discussed. ? ?  ? ? ?FINAL CLINICAL IMPRESSION(S) / ED DIAGNOSES  ? ?Final diagnoses:  ?Palpitations  ? ? ? ?Rx / DC Orders  ? ?ED Discharge Orders   ? ? None  ? ?  ? ? ? ?Note:  This document was prepared using Dragon voice recognition software and may include unintentional dictation errors. ?  ?Gilles Chiquito, MD ?11/23/21 [phone] ? ?

## 2022-06-16 ENCOUNTER — Emergency Department: Payer: Medicare Other

## 2022-06-16 ENCOUNTER — Emergency Department
Admission: EM | Admit: 2022-06-16 | Discharge: 2022-06-16 | Discharge status: Against Medical Advice | Payer: Medicare Other | Attending: Emergency Medicine | Admitting: Emergency Medicine

## 2022-06-16 ENCOUNTER — Other Ambulatory Visit: Payer: Self-pay

## 2022-06-16 DIAGNOSIS — Z5321 Procedure and treatment not carried out due to patient leaving prior to being seen by health care provider: Secondary | ICD-10-CM | POA: Diagnosis not present

## 2022-06-16 DIAGNOSIS — R079 Chest pain, unspecified: Secondary | ICD-10-CM | POA: Insufficient documentation

## 2022-06-16 LAB — BASIC METABOLIC PANEL
Anion gap: 9 (ref 5–15)
BUN: 12 mg/dL (ref 6–20)
CO2: 24 mmol/L (ref 22–32)
Calcium: 8.9 mg/dL (ref 8.9–10.3)
Chloride: 104 mmol/L (ref 98–111)
Creatinine, Ser: 1.15 mg/dL (ref 0.61–1.24)
GFR, Estimated: >60 mL/min (ref >60)
Glucose, Bld: 129 mg/dL — ABNORMAL HIGH (ref 70–99)
Potassium: 3.5 mmol/L (ref 3.5–5.1)
Sodium: 137 mmol/L (ref 135–145)

## 2022-06-16 LAB — TROPONIN I (HIGH SENSITIVITY)
Troponin I (High Sensitivity): <2 ng/L (ref <18)
Troponin I (High Sensitivity): 3 ng/L (ref <18)

## 2022-06-16 LAB — CBC
HCT: 46.4 % (ref 39.0–52.0)
Hemoglobin: 16.2 g/dL (ref 13.0–17.0)
MCH: 30.3 pg (ref 26.0–34.0)
MCHC: 34.9 g/dL (ref 30.0–36.0)
MCV: 86.9 fL (ref 80.0–100.0)
Platelets: 253 10*3/uL (ref 150–400)
RBC: 5.34 MIL/uL (ref 4.22–5.81)
RDW: 13.4 % (ref 11.5–15.5)
WBC: 7.3 10*3/uL (ref 4.0–10.5)
nRBC: 0 % (ref 0.0–0.2)

## 2022-06-16 NOTE — ED Notes (Signed)
No answer when called several times from lobby 

## 2022-06-16 NOTE — ED Triage Notes (Signed)
Pt states he was laying in bed tonight when he developed chest pain, pt states the feeling is overwhelming but cannot describe pain. Pt states he was given tylenol by ems and is starting to feel better. Pt denies any known cardiac hx.

## 2022-06-23 LAB — HEMOGLOBIN A1C: Hemoglobin A1C: 7.2

## 2022-06-24 ENCOUNTER — Encounter: Payer: Self-pay | Admitting: Nurse Practitioner

## 2022-06-24 ENCOUNTER — Other Ambulatory Visit: Payer: Self-pay

## 2022-06-24 ENCOUNTER — Other Ambulatory Visit: Payer: Self-pay | Admitting: Nurse Practitioner

## 2022-06-24 ENCOUNTER — Ambulatory Visit (INDEPENDENT_AMBULATORY_CARE_PROVIDER_SITE_OTHER): Payer: Medicare HMO | Admitting: Nurse Practitioner

## 2022-06-24 VITALS — BP 124/76 | HR 98 | Temp 98.7°F | Resp 18 | Ht 68.25 in | Wt 213.0 lb

## 2022-06-24 DIAGNOSIS — E119 Type 2 diabetes mellitus without complications: Secondary | ICD-10-CM | POA: Diagnosis not present

## 2022-06-24 DIAGNOSIS — E559 Vitamin D deficiency, unspecified: Secondary | ICD-10-CM

## 2022-06-24 DIAGNOSIS — E6609 Other obesity due to excess calories: Secondary | ICD-10-CM | POA: Diagnosis not present

## 2022-06-24 DIAGNOSIS — F251 Schizoaffective disorder, depressive type: Secondary | ICD-10-CM | POA: Diagnosis not present

## 2022-06-24 DIAGNOSIS — Z13220 Encounter for screening for lipoid disorders: Secondary | ICD-10-CM

## 2022-06-24 DIAGNOSIS — Z114 Encounter for screening for human immunodeficiency virus [HIV]: Secondary | ICD-10-CM

## 2022-06-24 DIAGNOSIS — Z6832 Body mass index (BMI) 32.0-32.9, adult: Secondary | ICD-10-CM

## 2022-06-24 DIAGNOSIS — E66811 Obesity, class 1: Secondary | ICD-10-CM | POA: Insufficient documentation

## 2022-06-24 DIAGNOSIS — Z1159 Encounter for screening for other viral diseases: Secondary | ICD-10-CM

## 2022-06-24 NOTE — Assessment & Plan Note (Signed)
Continue to see your mental health provider.  Continue taking your medications as prescribed.

## 2022-06-24 NOTE — Assessment & Plan Note (Signed)
Continue to work on eating well balanced with portion control.  Increase physical activity to at least 150 minutes a week.

## 2022-06-24 NOTE — Progress Notes (Signed)
BP 124/76   Pulse 98   Temp 98.7 F (37.1 C) (Oral)   Resp 18   Ht 5' 8.25" (1.734 m)   Wt 213 lb (96.6 kg)   SpO2 98%   BMI 32.15 kg/m    Subjective:    Patient ID: Clinton Hart, male    DOB: 1980/11/26, 41 y.o.   MRN: [id number]  HPI: Clinton Hart is a 41 y.o. male  Chief Complaint  Patient presents with   Establish Care   Labs Only    Discuss labs from in home visit.    Establish care:  His last physical was last year. He recently had home health come out and checked his A1C and it was 7.2 on 06/23/2022.  Diabetes: Home health nurse came out to see patient on 06/23/2022. She checked his A1C and it was 7.2.  Explained to patient that this places him in the diabetic range.  Discussed disease process and health maintenance that will need to be followed. Patient verbalized understanding. Discussed treatment options.  We are going to try lifestyle modification first.  Recommend decreasing sugar and processed foods in diet.  Patient will return in four months for follow up.    Schizophrenia: He currently gets his mental health care at [facility].  He is currently taking risperidone 4 mg daily and cogentin 0.25 mg daily. He reports he is doing well.     06/24/2022    1:29 PM  Depression screen PHQ 2/9  Decreased Interest 0  Down, Depressed, Hopeless 1  PHQ - 2 Score 1  Altered sleeping 1  Tired, decreased energy 1  Change in appetite 0  Feeling bad or failure about yourself  0  Trouble concentrating 1  Moving slowly or fidgety/restless 0  Suicidal thoughts 0  PHQ-9 Score 4  Difficult doing work/chores Not difficult at all       06/24/2022    1:29 PM  GAD 7 : Generalized Anxiety Score  Nervous, Anxious, on Edge 0  Control/stop worrying 0  Worry too much - different things 0  Trouble relaxing 0  Restless 0  Easily annoyed or irritable 0  Afraid - awful might happen 0  Total GAD 7 Score 0  Anxiety Difficulty Not difficult at all    Obesity:  He says  that eats a little bit of everything but is going to work on eating more well balanced.  He says that he usually plays basketball at the [facility].  He says that he tries to play once a week.   Relevant past medical, surgical, family and social history reviewed and updated as indicated. Interim medical history since our last visit reviewed. Allergies and medications reviewed and updated.  Review of Systems  Constitutional: Negative for fever or weight change.  Respiratory: Negative for cough and shortness of breath.   Cardiovascular: Negative for chest pain or palpitations.  Gastrointestinal: Negative for abdominal pain, no bowel changes.  Musculoskeletal: Negative for gait problem or joint swelling.  Skin: Negative for rash.  Neurological: Negative for dizziness or headache.  No other specific complaints in a complete review of systems (except as listed in HPI above).      Objective:    BP 124/76   Pulse 98   Temp 98.7 F (37.1 C) (Oral)   Resp 18   Ht 5' 8.25" (1.734 m)   Wt 213 lb (96.6 kg)   SpO2 98%   BMI 32.15 kg/m   Wt Readings from Last  3 Encounters:  06/24/22 213 lb (96.6 kg)  06/16/22 200 lb (90.7 kg)  11/20/21 200 lb (90.7 kg)    Physical Exam  Constitutional: Patient appears well-developed and well-nourished. Obese  No distress.  HEENT: head atraumatic, normocephalic, pupils equal and reactive to light, neck supple Cardiovascular: Normal rate, regular rhythm and normal heart sounds.  No murmur heard. No BLE edema. Pulmonary/Chest: Effort normal and breath sounds normal. No respiratory distress. Abdominal: Soft.  There is no tenderness. Psychiatric: Patient has a normal mood and affect. behavior is normal. Judgment and thought content normal.  Diabetic Foot Exam - Simple   Simple Foot Form Diabetic Foot exam was performed with the following findings: Yes 06/24/2022  2:10 PM  Visual Inspection No deformities, no ulcerations, no other skin breakdown bilaterally:  Yes Sensation Testing Intact to touch and monofilament testing bilaterally: Yes Pulse Check Posterior Tibialis and Dorsalis pulse intact bilaterally: Yes Comments      Results for orders placed or performed during the hospital encounter of 06/16/22  Basic metabolic panel  Result Value Ref Range   Sodium 137 135 - 145 mmol/L   Potassium 3.5 3.5 - 5.1 mmol/L   Chloride 104 98 - 111 mmol/L   CO2 24 22 - 32 mmol/L   Glucose, Bld 129 (H) 70 - 99 mg/dL   BUN 12 6 - 20 mg/dL   Creatinine, Ser 1.28 0.61 - 1.24 mg/dL   Calcium 8.9 8.9 - 78.6 mg/dL   GFR, Estimated >76 >72 mL/min   Anion gap 9 5 - 15  CBC  Result Value Ref Range   WBC 7.3 4.0 - 10.5 K/uL   RBC 5.34 4.22 - 5.81 MIL/uL   Hemoglobin 16.2 13.0 - 17.0 g/dL   HCT 09.4 70.9 - 62.8 %   MCV 86.9 80.0 - 100.0 fL   MCH 30.3 26.0 - 34.0 pg   MCHC 34.9 30.0 - 36.0 g/dL   RDW 36.6 29.4 - 76.5 %   Platelets 253 150 - 400 K/uL   nRBC 0.0 0.0 - 0.2 %  Troponin I (High Sensitivity)  Result Value Ref Range   Troponin I (High Sensitivity) <2 <18 ng/L  Troponin I (High Sensitivity)  Result Value Ref Range   Troponin I (High Sensitivity) 3 <18 ng/L      Assessment & Plan:   Problem List Items Addressed This Visit       Endocrine   New onset type 2 diabetes mellitus (HCC) - Primary    Recently had an A1c of 7.2.  Discussed disease process.  Patient will work on diet for the next 4 months.  And we will recheck A1c at that time if no improvement will start medications.      Relevant Orders   Microalbumin / creatinine urine ratio   HM Diabetes Foot Exam (Completed)     Other   Schizoaffective disorder (HCC)    Continue to see your mental health provider.  Continue taking your medications as prescribed.      Vitamin D deficiency    Currently taking vitamin D supplementation.  Check levels today.      Relevant Orders   VITAMIN D 25 Hydroxy (Vit-D Deficiency, Fractures)   Class 1 obesity due to excess calories with  serious comorbidity and body mass index (BMI) of 32.0 to 32.9 in adult    Continue to work on eating well balanced with portion control.  Increase physical activity to at least 150 minutes a week.  Other Visit Diagnoses     Screening for cholesterol level       Relevant Orders   Lipid panel   Encounter for hepatitis C screening test for low risk patient       Relevant Orders   Hepatitis C antibody   Screening for HIV without presence of risk factors       Relevant Orders   HIV Antibody (routine testing w rflx)        Follow up plan: Return in about 4 months (around 10/25/2022) for follow up.

## 2022-06-24 NOTE — Assessment & Plan Note (Signed)
Recently had an A1c of 7.2.  Discussed disease process.  Patient will work on diet for the next 4 months.  And we will recheck A1c at that time if no improvement will start medications.

## 2022-06-24 NOTE — Assessment & Plan Note (Signed)
Currently taking vitamin D supplementation.  Check levels today.

## 2022-06-25 LAB — LIPID PANEL
Cholesterol: 188 mg/dL (ref <200)
HDL: 43 mg/dL (ref >=40)
LDL Cholesterol (Calc): 124 mg/dL (calc) — ABNORMAL HIGH
Non-HDL Cholesterol (Calc): 145 mg/dL (calc) — ABNORMAL HIGH (ref <130)
Total CHOL/HDL Ratio: 4.4 (calc) (ref <5.0)
Triglycerides: 103 mg/dL (ref <150)

## 2022-06-25 LAB — HEPATITIS C ANTIBODY: Hepatitis C Ab: NONREACTIVE

## 2022-06-25 LAB — MICROALBUMIN / CREATININE URINE RATIO
Creatinine, Urine: 267 mg/dL (ref 20–320)
Microalb Creat Ratio: 2 mcg/mg creat (ref <30)
Microalb, Ur: 0.5 mg/dL

## 2022-06-25 LAB — HIV ANTIBODY (ROUTINE TESTING W REFLEX): HIV 1&2 Ab, 4th Generation: NONREACTIVE

## 2022-06-25 LAB — VITAMIN D 25 HYDROXY (VIT D DEFICIENCY, FRACTURES): Vit D, 25-Hydroxy: 20 ng/mL — ABNORMAL LOW (ref 30–100)

## 2022-08-10 ENCOUNTER — Emergency Department: Payer: Medicare HMO

## 2022-08-10 ENCOUNTER — Emergency Department
Admission: EM | Admit: 2022-08-10 | Discharge: 2022-08-11 | Disposition: A | Discharge status: Home / Self Care | Payer: Medicare HMO | Attending: Emergency Medicine | Admitting: Emergency Medicine

## 2022-08-10 ENCOUNTER — Encounter: Payer: Self-pay | Admitting: Emergency Medicine

## 2022-08-10 DIAGNOSIS — R42 Dizziness and giddiness: Secondary | ICD-10-CM | POA: Insufficient documentation

## 2022-08-10 DIAGNOSIS — U071 COVID-19: Secondary | ICD-10-CM | POA: Diagnosis not present

## 2022-08-10 DIAGNOSIS — R059 Cough, unspecified: Secondary | ICD-10-CM | POA: Diagnosis present

## 2022-08-10 LAB — BASIC METABOLIC PANEL
Anion gap: 7 (ref 5–15)
BUN: 10 mg/dL (ref 6–20)
CO2: 24 mmol/L (ref 22–32)
Calcium: 8.3 mg/dL — ABNORMAL LOW (ref 8.9–10.3)
Chloride: 109 mmol/L (ref 98–111)
Creatinine, Ser: 1.16 mg/dL (ref 0.61–1.24)
GFR, Estimated: >60 mL/min (ref >60)
Glucose, Bld: 116 mg/dL — ABNORMAL HIGH (ref 70–99)
Potassium: 3.7 mmol/L (ref 3.5–5.1)
Sodium: 140 mmol/L (ref 135–145)

## 2022-08-10 LAB — CBC
HCT: 42.4 % (ref 39.0–52.0)
Hemoglobin: 15.1 g/dL (ref 13.0–17.0)
MCH: 30.6 pg (ref 26.0–34.0)
MCHC: 35.6 g/dL (ref 30.0–36.0)
MCV: 85.8 fL (ref 80.0–100.0)
Platelets: 232 10*3/uL (ref 150–400)
RBC: 4.94 MIL/uL (ref 4.22–5.81)
RDW: 13.2 % (ref 11.5–15.5)
WBC: 4.9 10*3/uL (ref 4.0–10.5)
nRBC: 0 % (ref 0.0–0.2)

## 2022-08-10 LAB — TROPONIN I (HIGH SENSITIVITY)
Troponin I (High Sensitivity): 3 ng/L (ref <18)
Troponin I (High Sensitivity): <2 ng/L (ref <18)

## 2022-08-10 LAB — SARS CORONAVIRUS 2 BY RT PCR: SARS Coronavirus 2 by RT PCR: POSITIVE — AB

## 2022-08-10 MED ORDER — METHYLPREDNISOLONE SODIUM SUCC 125 MG IJ SOLR
125.0000 mg | Freq: Once | INTRAMUSCULAR | Status: AC
  Administered 2022-08-10: 125 mg via INTRAVENOUS
  Filled 2022-08-10: qty 2

## 2022-08-10 MED ORDER — ALBUTEROL SULFATE HFA 108 (90 BASE) MCG/ACT IN AERS: 2.0000 | INHALATION_SPRAY | Freq: Four times a day (QID) | RESPIRATORY_TRACT | 0 refills | Status: DC | PRN

## 2022-08-10 MED ORDER — ONDANSETRON 4 MG PO TBDP: 4.0000 mg | ORAL_TABLET | Freq: Three times a day (TID) | ORAL | 0 refills | Status: DC | PRN

## 2022-08-10 MED ORDER — ALBUTEROL SULFATE HFA 108 (90 BASE) MCG/ACT IN AERS
2.0000 | INHALATION_SPRAY | Freq: Once | RESPIRATORY_TRACT | Status: AC
  Administered 2022-08-10: 2 via RESPIRATORY_TRACT
  Filled 2022-08-10 (×2): qty 6.7

## 2022-08-10 MED ORDER — SODIUM CHLORIDE 0.9 % IV BOLUS
1000.0000 mL | Freq: Once | INTRAVENOUS | Status: AC
  Administered 2022-08-10: 1000 mL via INTRAVENOUS

## 2022-08-10 MED ORDER — PREDNISONE 20 MG PO TABS: 40.0000 mg | ORAL_TABLET | Freq: Every day | ORAL | 0 refills | Status: AC

## 2022-08-10 NOTE — ED Provider Notes (Signed)
[facility] Provider Note    Event Date/Time   First MD Initiated Contact with Patient 08/10/22 2144     (approximate)   History   Dizziness   HPI  Clinton Hart is a 41 y.o. male  here with cough, weakness. Pt reports that over the past several days he has had mild cough, nasal congestion, and loss of appetite. Earlier today, he began to feel lightheaded with standing and had experienced chest pain, which he describes as pain of his anterior chest worse with coughing. He also felt like he was going to pass out when standing. Denies any nausea. He does admit he has not been eating/drinking much due to his "cold."       Physical Exam   Triage Vital Signs: ED Triage Vitals  Enc Vitals Group     BP 08/10/22 2103 109/85     Pulse Rate 08/10/22 2103 85     Resp 08/10/22 2103 20     Temp 08/10/22 2103 98.2 F (36.8 C)     Temp Source 08/10/22 2103 Oral     SpO2 08/10/22 2059 99 %     Weight 08/10/22 2103 200 lb (90.7 kg)     Height 08/10/22 2103 5\' 9"  (1.753 m)     Head Circumference --      Peak Flow --      Pain Score 08/10/22 2103 0     Pain Loc --      Pain Edu? --      Excl. in GC? --     Most recent vital signs: Vitals:   08/10/22 2059 08/10/22 2103  BP:  109/85  Pulse:  85  Resp:  20  Temp:  98.2 F (36.8 C)  SpO2: 99% 98%     General: Awake, no distress.  CV:  Good peripheral perfusion. No murmurs. RRR. Resp:  Normal effort. Mild expiratory wheezes appreciated. Abd:  No distention. NO tenderness. Other:  No focal neuro deficits. Mildly dry MM.   ED Results / Procedures / Treatments   Labs (all labs ordered are listed, but only abnormal results are displayed) Labs Reviewed  SARS CORONAVIRUS 2 BY RT PCR - Abnormal; Notable for the following components:      Result Value   SARS Coronavirus 2 by RT PCR POSITIVE (*)    All other components within normal limits  BASIC METABOLIC PANEL - Abnormal; Notable for the following  components:   Glucose, Bld 116 (*)    Calcium 8.3 (*)    All other components within normal limits  CBC  TROPONIN I (HIGH SENSITIVITY)  TROPONIN I (HIGH SENSITIVITY)     EKG Normal sinus rhythm, VR 79. PR 138, QRS 98, QTc 442. No acute st elevations. No ischemia or infarct.   RADIOLOGY CXR: No active disease   I also independently reviewed and agree with radiologist interpretations.   PROCEDURES:  Critical Care performed: No   MEDICATIONS ORDERED IN ED: Medications  sodium chloride 0.9 % bolus 1,000 mL (0 mLs Intravenous Stopped 08/10/22 2355)  methylPREDNISolone sodium succinate (SOLU-MEDROL) 125 mg/2 mL injection 125 mg (125 mg Intravenous Given 08/10/22 2232)  albuterol (VENTOLIN HFA) 108 (90 Base) MCG/ACT inhaler 2 puff (2 puffs Inhalation Given 08/10/22 2354)     IMPRESSION / MDM / ASSESSMENT AND PLAN / ED COURSE  I reviewed the triage vital signs and the nursing notes.  41 yo M here with cough, near syncope. Pt hypotensive on EMS arrival but this has improved with iVF. Here he has some mild wheezing on exam with a h/o asthma, as well as history concerning for possible URI. COVID tested and is positive. CBC shows no leukocytosis or anemia. Electrolytes wnl.  EKG is nonischemic and tropnoin negative - suspect his CP is chest wall related and do not suspect ACS. He has no tachycardia, tachypnea, hypoxia, or signs to suggest PE. BP improved with IVF and likely is related to poor PO intake.  Will treat supportively, give steroids for asthma and good return precautions.  FINAL CLINICAL IMPRESSION(S) / ED DIAGNOSES   Final diagnoses:  COVID-19  Lightheadedness     Rx / DC Orders   ED Discharge Orders          Ordered    albuterol (VENTOLIN HFA) 108 (90 Base) MCG/ACT inhaler  Every 6 hours PRN        08/10/22 2344    ondansetron (ZOFRAN-ODT) 4 MG disintegrating tablet  Every 8 hours PRN        08/10/22 2344    predniSONE  (DELTASONE) 20 MG tablet  Daily        08/10/22 2344             Note:  This document was prepared using Dragon voice recognition software and may include unintentional dictation errors.   Shaune Pollack, MD 08/11/22 [phone]

## 2022-08-10 NOTE — ED Triage Notes (Incomplete)
Pt presents via EMS with complaints of dizziness that started tonight. Pt endorsed CP that had resolved upon EMS arrival. Pt was initally hypotensive BP 72/46 (manual) received 5   Denies CP or SOB.

## 2022-08-10 NOTE — ED Triage Notes (Signed)
Pt presents via EMS with complaints of dizziness that started tonight. Pt initially had CP but it resolved upon EMS arrival. Pt was hypotensive with a BP of 76/46 and received of NS via 20G IV in the LFA. Denies LOC, Falls, CP at this time or SOB.

## 2022-08-12 ENCOUNTER — Telehealth: Payer: Self-pay

## 2022-08-12 NOTE — Telephone Encounter (Signed)
Transition Care Management Follow-up Telephone Call Date of discharge and from where: [place] [facility] 08/11/2022 How have you been since you were released from the hospital? better Any questions or concerns? No  Items Reviewed: Did the pt receive and understand the discharge instructions provided? Yes  Medications obtained and verified? Yes  Other? No  Any new allergies since your discharge? No  Dietary orders reviewed? Yes Do you have support at home? Yes   Home Care and Equipment/Supplies: Were home health services ordered? no If so, what is the name of the agency? N/a  Has the agency set up a time to come to the patient's home? not applicable Were any new equipment or medical supplies ordered?  No What is the name of the medical supply agency? N/a Were you able to get the supplies/equipment? not applicable Do you have any questions related to the use of the equipment or supplies? No  Functional Questionnaire: (I = Independent and D = Dependent) ADLs: I  Bathing/Dressing- I  Meal Prep- I  Eating- I  Maintaining continence- I  Transferring/Ambulation- I  Managing Meds- I  Follow up appointments reviewed:  PCP Hospital f/u appt confirmed? Yes  Scheduled to see Della Goo on 08/17/2022 @ 2:20. Specialist Hospital f/u appt confirmed? No   Are transportation arrangements needed? No  If their condition worsens, is the pt aware to call PCP or go to the Emergency Dept.? Yes Was the patient provided with contact information for the PCP's office or ED? Yes Was to pt encouraged to call back with questions or concerns? Yes Karena Addison, LPN [facility] Nurse Health Advisor Direct Dial [phone]

## 2022-08-17 ENCOUNTER — Ambulatory Visit: Payer: Medicare HMO | Admitting: Family Medicine

## 2022-09-17 ENCOUNTER — Other Ambulatory Visit: Payer: Self-pay

## 2022-09-17 ENCOUNTER — Emergency Department
Admission: EM | Admit: 2022-09-17 | Discharge: 2022-09-17 | Disposition: A | Discharge status: Home / Self Care | Payer: Medicare HMO | Attending: Emergency Medicine | Admitting: Emergency Medicine

## 2022-09-17 DIAGNOSIS — L03011 Cellulitis of right finger: Secondary | ICD-10-CM | POA: Insufficient documentation

## 2022-09-17 DIAGNOSIS — M79644 Pain in right finger(s): Secondary | ICD-10-CM | POA: Diagnosis present

## 2022-09-17 MED ORDER — CEPHALEXIN 500 MG PO CAPS: 500.0000 mg | ORAL_CAPSULE | Freq: Three times a day (TID) | ORAL | 0 refills | Status: DC

## 2022-09-17 NOTE — ED Triage Notes (Signed)
Pt reports infection to his right hand middle finger for several days.

## 2022-09-17 NOTE — ED Provider Notes (Signed)
[facility] Provider Note    Event Date/Time   First MD Initiated Contact with Patient 09/17/22 1319     (approximate)   History   Hand Pain   HPI  Clinton Hart is a 41 y.o. male  here with right 3rd finger pain without injury for several days.      Physical Exam   Triage Vital Signs: ED Triage Vitals  Enc Vitals Group     BP 09/17/22 1322 116/64     Pulse Rate 09/17/22 1322 80     Resp 09/17/22 1322 16     Temp 09/17/22 1322 98.2 F (36.8 C)     Temp Source 09/17/22 1322 Oral     SpO2 09/17/22 1322 100 %     Weight 09/17/22 1316 200 lb (90.7 kg)     Height 09/17/22 1316 5\' 10"  (1.778 m)     Head Circumference --      Peak Flow --      Pain Score 09/17/22 1316 6     Pain Loc --      Pain Edu? --      Excl. in GC? --     Most recent vital signs: Vitals:   09/17/22 1322  BP: 116/64  Pulse: 80  Resp: 16  Temp: 98.2 F (36.8 C)  SpO2: 100%     General: Awake, no distress.  CV:  Good peripheral perfusion.  Resp:  Normal effort.  Abd:  No distention.  Other:  Right third digit just below the cuticle is erythematous with fluctuant area.  Area is tender to touch.  Patient is able to flex and extend digit without any difficulty.  Capillary refills less than 3 seconds.   ED Results / Procedures / Treatments   Labs (all labs ordered are listed, but only abnormal results are displayed) Labs Reviewed - No data to display     PROCEDURES:  Critical Care performed:   12/30/23Marland KitchenIncision and Drainage  Date/Time: 09/17/2022 1:20 PM  Performed by: 09/19/2022, PA-C Authorized by: Tommi Rumps, PA-C   Consent:    Consent obtained:  Verbal   Consent given by:  Patient   Risks discussed:  Incomplete drainage and infection Universal protocol:    Patient identity confirmed:  Verbally with patient Location:    Type:  Abscess   Location:  Upper extremity   Upper extremity location:  Finger   Finger location:  R long  finger Pre-procedure details:    Skin preparation:  Antiseptic wash Sedation:    Sedation type:  None Anesthesia:    Anesthesia method:  None Procedure type:    Complexity:  Simple Procedure details:    Incision types:  Stab incision   Incision depth:  Dermal   Drainage:  Purulent   Drainage amount:  Moderate   Wound treatment:  Wound left open Post-procedure details:    Procedure completion:  Tolerated    MEDICATIONS ORDERED IN ED: Medications - No data to display   IMPRESSION / MDM / ASSESSMENT AND PLAN / ED COURSE  I reviewed the triage vital signs and the nursing notes.   Differential diagnosis includes, but is not limited to, cellulitis right third digit, paronychia, contusion.  41 year old male presents to the ED with paronychia to his right third digit.  Area was opened with an 18-gauge needle without any difficulty and patient is aware that he needs to go home and soak this 3-4 times a day with warm water  or using warm moist compresses.  A prescription for Keflex 500 mg was sent to the pharmacy for patient to begin taking.  He is also aware that he can take Tylenol or ibuprofen if needed for pain.  He will follow-up with his PCP  or urgent care if any continued problems.      Patient's presentation is most consistent with common illness. FINAL CLINICAL IMPRESSION(S) / ED DIAGNOSES   Final diagnoses:  Paronychia of finger, right     Rx / DC Orders   ED Discharge Orders          Ordered    cephALEXin (KEFLEX) 500 MG capsule  3 times daily        09/17/22 1328             Note:  This document was prepared using Dragon voice recognition software and may include unintentional dictation errors.   Tommi Rumps, PA-C 09/17/22 1517    Jene Every, MD 09/17/22 1525

## 2022-09-17 NOTE — Discharge Instructions (Signed)
Start taking antibiotics today and begin soaking your hand in warm water 2-3 times a day.  Take tylenol as needed for pain.

## 2022-10-25 NOTE — Progress Notes (Deleted)
There were no vitals taken for this visit.   Subjective:    Patient ID: Clinton Hart, male    DOB: 1981/06/05, 42 y.o.   MRN: [id number]  HPI: Clinton Hart is a 42 y.o. male  No chief complaint on file.  Diabetes: Last office visit, patient was a new patient who had recently had a home health nurse come out on 06/23/2022.  She checked his A1C was 7.2.  patient wanted to try doing life style modifications before starting medication.    Schizophrenia: he is established with psychiatry at [facility].  He currently takes cogentin 0.25 mg daily and risperidone 4 mg daily.  He reports ***     06/24/2022    1:29 PM  Depression screen PHQ 2/9  Decreased Interest 0  Down, Depressed, Hopeless 1  PHQ - 2 Score 1  Altered sleeping 1  Tired, decreased energy 1  Change in appetite 0  Feeling bad or failure about yourself  0  Trouble concentrating 1  Moving slowly or fidgety/restless 0  Suicidal thoughts 0  PHQ-9 Score 4  Difficult doing work/chores Not difficult at all       06/24/2022    1:29 PM  GAD 7 : Generalized Anxiety Score  Nervous, Anxious, on Edge 0  Control/stop worrying 0  Worry too much - different things 0  Trouble relaxing 0  Restless 0  Easily annoyed or irritable 0  Afraid - awful might happen 0  Total GAD 7 Score 0  Anxiety Difficulty Not difficult at all    Obesity: his weight today is *** with a BMI of ***.  He plays basketball at the [employer] and he as been working on his diet.   Relevant past medical, surgical, family and social history reviewed and updated as indicated. Interim medical history since our last visit reviewed. Allergies and medications reviewed and updated.  Review of Systems  Constitutional: Negative for fever or weight change.  Respiratory: Negative for cough and shortness of breath.   Cardiovascular: Negative for chest pain or palpitations.  Gastrointestinal: Negative for abdominal pain, no bowel changes.  Musculoskeletal:  Negative for gait problem or joint swelling.  Skin: Negative for rash.  Neurological: Negative for dizziness or headache.  No other specific complaints in a complete review of systems (except as listed in HPI above).      Objective:    There were no vitals taken for this visit.  Wt Readings from Last 3 Encounters:  09/17/22 200 lb (90.7 kg)  08/10/22 200 lb (90.7 kg)  06/24/22 213 lb (96.6 kg)    Physical Exam  Constitutional: Patient appears well-developed and well-nourished. Obese  No distress.  HEENT: head atraumatic, normocephalic, pupils equal and reactive to light, neck supple Cardiovascular: Normal rate, regular rhythm and normal heart sounds.  No murmur heard. No BLE edema. Pulmonary/Chest: Effort normal and breath sounds normal. No respiratory distress. Abdominal: Soft.  There is no tenderness. Psychiatric: Patient has a normal mood and affect. behavior is normal. Judgment and thought content normal.  Diabetic Foot Exam - Simple   No data filed      Results for orders placed or performed during the hospital encounter of 08/10/22  SARS Coronavirus 2 by RT PCR (hospital order, performed in [facility] hospital lab) *cepheid single result test* Anterior Nasal Swab   Specimen: Anterior Nasal Swab  Result Value Ref Range   SARS Coronavirus 2 by RT PCR POSITIVE (A) NEGATIVE  Basic metabolic panel  Result Value Ref Range   Sodium 140 135 - 145 mmol/L   Potassium 3.7 3.5 - 5.1 mmol/L   Chloride 109 98 - 111 mmol/L   CO2 24 22 - 32 mmol/L   Glucose, Bld 116 (H) 70 - 99 mg/dL   BUN 10 6 - 20 mg/dL   Creatinine, Ser 1.16 0.61 - 1.24 mg/dL   Calcium 8.3 (L) 8.9 - 10.3 mg/dL   GFR, Estimated >60 >60 mL/min   Anion gap 7 5 - 15  CBC  Result Value Ref Range   WBC 4.9 4.0 - 10.5 K/uL   RBC 4.94 4.22 - 5.81 MIL/uL   Hemoglobin 15.1 13.0 - 17.0 g/dL   HCT 42.4 39.0 - 52.0 %   MCV 85.8 80.0 - 100.0 fL   MCH 30.6 26.0 - 34.0 pg   MCHC 35.6 30.0 - 36.0 g/dL   RDW 13.2 11.5 -  15.5 %   Platelets 232 150 - 400 K/uL   nRBC 0.0 0.0 - 0.2 %  Troponin I (High Sensitivity)  Result Value Ref Range   Troponin I (High Sensitivity) 3 <18 ng/L  Troponin I (High Sensitivity)  Result Value Ref Range   Troponin I (High Sensitivity) <2 <18 ng/L      Assessment & Plan:   Problem List Items Addressed This Visit   None    Follow up plan: No follow-ups on file.

## 2022-10-26 ENCOUNTER — Ambulatory Visit: Payer: 59 | Admitting: Nurse Practitioner

## 2022-10-26 DIAGNOSIS — E6609 Other obesity due to excess calories: Secondary | ICD-10-CM

## 2022-10-26 DIAGNOSIS — E119 Type 2 diabetes mellitus without complications: Secondary | ICD-10-CM

## 2022-10-26 DIAGNOSIS — F251 Schizoaffective disorder, depressive type: Secondary | ICD-10-CM

## 2022-10-29 ENCOUNTER — Emergency Department: Payer: 59

## 2022-10-29 ENCOUNTER — Emergency Department
Admission: EM | Admit: 2022-10-29 | Discharge: 2022-10-29 | Disposition: A | Discharge status: Home / Self Care | Payer: 59 | Attending: Emergency Medicine | Admitting: Emergency Medicine

## 2022-10-29 ENCOUNTER — Other Ambulatory Visit: Payer: Self-pay

## 2022-10-29 ENCOUNTER — Emergency Department (HOSPITAL_COMMUNITY): Payer: 59

## 2022-10-29 DIAGNOSIS — R059 Cough, unspecified: Secondary | ICD-10-CM | POA: Diagnosis present

## 2022-10-29 DIAGNOSIS — Z20822 Contact with and (suspected) exposure to covid-19: Secondary | ICD-10-CM | POA: Diagnosis not present

## 2022-10-29 DIAGNOSIS — J069 Acute upper respiratory infection, unspecified: Secondary | ICD-10-CM | POA: Diagnosis not present

## 2022-10-29 DIAGNOSIS — B9789 Other viral agents as the cause of diseases classified elsewhere: Secondary | ICD-10-CM | POA: Insufficient documentation

## 2022-10-29 DIAGNOSIS — Z8616 Personal history of COVID-19: Secondary | ICD-10-CM | POA: Insufficient documentation

## 2022-10-29 LAB — RESP PANEL BY RT-PCR (RSV, FLU A&B, COVID)  RVPGX2
Influenza A by PCR: NEGATIVE
Influenza B by PCR: NEGATIVE
Resp Syncytial Virus by PCR: NEGATIVE
SARS Coronavirus 2 by RT PCR: NEGATIVE

## 2022-10-29 NOTE — ED Provider Notes (Signed)
[facility] Provider Note    Event Date/Time   First MD Initiated Contact with Patient 10/29/22 0510     (approximate)   History   Nasal Congestion   HPI  Clinton Hart is a 42 y.o. male who presents to the ED for evaluation of Nasal Congestion   Patient presents to the ED for evaluation of nasal congestion, nonproductive cough and diffuse myalgias over the past couple days.  Reports he had COVID last year and his symptoms were similar so he presents for a COVID test.  No chest pain, no syncope or abdominal pain.   Physical Exam   Triage Vital Signs: ED Triage Vitals  Enc Vitals Group     BP      Pulse      Resp      Temp      Temp src      SpO2      Weight      Height      Head Circumference      Peak Flow      Pain Score      Pain Loc      Pain Edu?      Excl. in [place]?     Most recent vital signs: Vitals:   10/29/22 0514  BP: 122/81  Pulse: (!) 115  Resp: 14  Temp: 98.1 F (36.7 C)  SpO2: 96%    General: Awake, no distress.  Frequent sniffling.  Looks well, fluent and conversational. CV:  Good peripheral perfusion.  RRR without appreciable murmur Resp:  Normal effort.  CTAB Abd:  No distention.  MSK:  No deformity noted.  Neuro:  No focal deficits appreciated. Other:     ED Results / Procedures / Treatments   Labs (all labs ordered are listed, but only abnormal results are displayed) Labs Reviewed  RESP PANEL BY RT-PCR (RSV, FLU A&B, COVID)  [id number]    EKG   RADIOLOGY CXR interpreted by me without evidence of acute cardiopulmonary pathology.  Official radiology report(s): DG Chest 2 View  Result Date: 10/29/2022 CLINICAL DATA:  42 year old male with history of cough, tachycardia and runny nose. EXAM: CHEST - 2 VIEW COMPARISON:  Chest x-ray 08/10/2022. FINDINGS: Lung volumes are normal. No consolidative airspace disease. No pleural effusions. No pneumothorax. No pulmonary nodule or mass noted. Pulmonary  vasculature and the cardiomediastinal silhouette are within normal limits. IMPRESSION: No radiographic evidence of acute cardiopulmonary disease. Electronically Signed   By: Vinnie Langton M.D.   On: 10/29/2022 05:31    PROCEDURES and INTERVENTIONS:  Procedures  Medications - No data to display   IMPRESSION / MDM / [place] / ED COURSE  I reviewed the triage vital signs and the nursing notes.  Differential diagnosis includes, but is not limited to, viral URI, pneumonia, pneumothorax  42 year old presents with evidence of a viral URI.  Cough and congestion with a clear x-ray and he looks well.  His triage tachycardia is noted and he has no chest pain or dyspnea to suggest more serious derangement such as PE or ACS.  Flu/COVID swab is pending.  No indications for antibiotics at this point.  No barriers to outpatient management.  We discussed expectant management and return precautions       FINAL CLINICAL IMPRESSION(S) / ED DIAGNOSES   Final diagnoses:  Viral URI with cough     Rx / DC Orders   ED Discharge Orders     None  Note:  This document was prepared using Dragon voice recognition software and may include unintentional dictation errors.   Vladimir Crofts, MD 10/29/22 [phone]

## 2022-10-29 NOTE — ED Triage Notes (Signed)
Pt to ED via POV c/o Covid symptoms. Pt reports having runny nose and cough that started yesterday. Denies fever, CP, SOB. NAD at this time

## 2022-10-29 NOTE — ED Notes (Signed)
Pt back from XR 

## 2022-10-29 NOTE — Discharge Instructions (Signed)
Check your MyChart results for your flu/COVID/RSV test result  Please take Tylenol and ibuprofen/Advil for your pain.  It is safe to take them together, or to alternate them every few hours.  Take up to 1000mg  of Tylenol at a time, up to 4 times per day.  Do not take more than 4000 mg of Tylenol in 24 hours.  For ibuprofen, take 400-600 mg, 3 - 4 times per day.

## 2023-03-04 ENCOUNTER — Other Ambulatory Visit: Payer: Self-pay

## 2023-03-04 ENCOUNTER — Emergency Department: Payer: 59

## 2023-03-04 ENCOUNTER — Emergency Department
Admission: EM | Admit: 2023-03-04 | Discharge: 2023-03-04 | Disposition: A | Discharge status: Home / Self Care | Payer: 59 | Attending: Emergency Medicine | Admitting: Emergency Medicine

## 2023-03-04 DIAGNOSIS — M7989 Other specified soft tissue disorders: Secondary | ICD-10-CM | POA: Diagnosis not present

## 2023-03-04 DIAGNOSIS — S42024A Nondisplaced fracture of shaft of right clavicle, initial encounter for closed fracture: Secondary | ICD-10-CM | POA: Insufficient documentation

## 2023-03-04 DIAGNOSIS — Y92410 Unspecified street and highway as the place of occurrence of the external cause: Secondary | ICD-10-CM | POA: Diagnosis not present

## 2023-03-04 DIAGNOSIS — Y9355 Activity, bike riding: Secondary | ICD-10-CM | POA: Insufficient documentation

## 2023-03-04 DIAGNOSIS — S4992XA Unspecified injury of left shoulder and upper arm, initial encounter: Secondary | ICD-10-CM | POA: Diagnosis present

## 2023-03-04 DIAGNOSIS — S42001A Fracture of unspecified part of right clavicle, initial encounter for closed fracture: Secondary | ICD-10-CM

## 2023-03-04 MED ORDER — MELOXICAM 15 MG PO TABS: 15.0000 mg | ORAL_TABLET | Freq: Every day | ORAL | 1 refills | Status: AC

## 2023-03-04 MED ORDER — KETOROLAC TROMETHAMINE 15 MG/ML IJ SOLN
15.0000 mg | Freq: Once | INTRAMUSCULAR | Status: AC
  Administered 2023-03-04: 15 mg via INTRAMUSCULAR
  Filled 2023-03-04: qty 1
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
## 2023-03-04 NOTE — Discharge Instructions (Addendum)
Take Meloxicam once daily for pain and inflammation.  
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
## 2023-03-19 ENCOUNTER — Emergency Department
Admission: EM | Admit: 2023-03-19 | Discharge: 2023-03-19 | Disposition: A | Discharge status: Home / Self Care | Payer: 59 | Attending: Emergency Medicine | Admitting: Emergency Medicine

## 2023-03-19 ENCOUNTER — Other Ambulatory Visit: Payer: Self-pay

## 2023-03-19 ENCOUNTER — Emergency Department: Payer: 59

## 2023-03-19 DIAGNOSIS — E119 Type 2 diabetes mellitus without complications: Secondary | ICD-10-CM | POA: Diagnosis not present

## 2023-03-19 DIAGNOSIS — X58XXXD Exposure to other specified factors, subsequent encounter: Secondary | ICD-10-CM | POA: Insufficient documentation

## 2023-03-19 DIAGNOSIS — R0602 Shortness of breath: Secondary | ICD-10-CM | POA: Insufficient documentation

## 2023-03-19 DIAGNOSIS — J45909 Unspecified asthma, uncomplicated: Secondary | ICD-10-CM | POA: Diagnosis not present

## 2023-03-19 DIAGNOSIS — S42022D Displaced fracture of shaft of left clavicle, subsequent encounter for fracture with routine healing: Secondary | ICD-10-CM | POA: Diagnosis not present

## 2023-03-19 DIAGNOSIS — S4992XD Unspecified injury of left shoulder and upper arm, subsequent encounter: Secondary | ICD-10-CM | POA: Diagnosis present

## 2023-03-19 MED ORDER — ALBUTEROL SULFATE HFA 108 (90 BASE) MCG/ACT IN AERS
2.0000 | INHALATION_SPRAY | RESPIRATORY_TRACT | Status: DC | PRN
  Filled 2023-03-19: qty 6.7

## 2023-03-19 MED ORDER — KETOROLAC TROMETHAMINE 30 MG/ML IJ SOLN
30.0000 mg | Freq: Once | INTRAMUSCULAR | Status: AC
  Administered 2023-03-19: 30 mg via INTRAMUSCULAR
  Filled 2023-03-19: qty 1

## 2023-03-19 MED ORDER — ALBUTEROL SULFATE HFA 108 (90 BASE) MCG/ACT IN AERS: 2.0000 | INHALATION_SPRAY | Freq: Four times a day (QID) | RESPIRATORY_TRACT | 0 refills | Status: DC | PRN

## 2023-03-19 MED ORDER — IPRATROPIUM-ALBUTEROL 0.5-2.5 (3) MG/3ML IN SOLN
3.0000 mL | Freq: Once | RESPIRATORY_TRACT | Status: AC
  Administered 2023-03-19: 3 mL via RESPIRATORY_TRACT
  Filled 2023-03-19: qty 3

## 2023-03-19 NOTE — ED Triage Notes (Signed)
First Nurse Note:  BIB [facility] from home. Reports SOB that woke him from sleep. No wheeze noted with EMS. Pt reports out of inhaler prescription for "months to years." Pt alert and oriented. Breathing unlabored with EMS. Ambulatory with ease.   106/59 98 HR  97% RA RR 18
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
## 2023-05-17 NOTE — Progress Notes (Deleted)
Name: Clinton Hart   MRN: [id number]    DOB: 08-Jan-1981   Date:05/17/2023       Progress Note  Subjective  Chief Complaint  No chief complaint on file.   HPI  Patient presents for annual CPE.  Diet: *** Exercise: ***  Last Eye Exam: *** Last Dental Exam: ***  Flowsheet Row Office Visit from 06/24/2022 in [facility]  AUDIT-C Score 1      Depression: Phq 9 is  {Desc; negative/positive:13464}    06/24/2022    1:29 PM  Depression screen PHQ 2/9  Decreased Interest 0  Down, Depressed, Hopeless 1  PHQ - 2 Score 1  Altered sleeping 1  Tired, decreased energy 1  Change in appetite 0  Feeling bad or failure about yourself  0  Trouble concentrating 1  Moving slowly or fidgety/restless 0  Suicidal thoughts 0  PHQ-9 Score 4  Difficult doing work/chores Not difficult at all   Hypertension: BP Readings from Last 3 Encounters:  03/19/23 125/78  03/04/23 112/77  10/29/22 122/81   Obesity: Wt Readings from Last 3 Encounters:  03/19/23 190 lb (86.2 kg)  03/04/23 190 lb (86.2 kg)  10/29/22 200 lb (90.7 kg)   BMI Readings from Last 3 Encounters:  03/19/23 28.06 kg/m  03/04/23 28.06 kg/m  10/29/22 29.53 kg/m     Vaccines:   HPV:  Tdap:  Shingrix:  Pneumonia:  Flu:  COVID-19:   Hep C Screening:  STD testing and prevention (HIV/chl/gon/syphilis):  Intimate partner violence: {Desc; negative/positive:13464} screen  Sexual History : Menstrual History/LMP/Abnormal Bleeding:  Discussed importance of follow up if any post-menopausal bleeding: {Response; yes/no/na:63}  Incontinence Symptoms: {Desc; negative/positive:13464} for symptoms   Breast cancer:  - Last Mammogram: *** - BRCA gene screening:   Osteoporosis Prevention : Discussed high calcium and vitamin D supplementation, weight bearing exercises Bone density :{Response; yes/no/na:63}   Cervical cancer screening:   Skin cancer: Discussed monitoring for atypical lesions   Colorectal cancer: ***   Lung cancer:  Low Dose CT Chest recommended if Age 41-80 years, 20 pack-year currently smoking OR have quit w/in 15years. Patient {DOES NOT does:27190::"does not"} qualify for screen   ECG: ***  Advanced Care Planning: A voluntary discussion about advance care planning including the explanation and discussion of advance directives.  Discussed health care proxy and Living will, and the patient was able to identify a health care proxy as ***.  Patient {DOES_DOES NWG:95621} have a living will and power of attorney of health care   Lipids: Lab Results  Component Value Date   CHOL 188 06/24/2022   CHOL 180 02/22/2020   Lab Results  Component Value Date   HDL 43 06/24/2022   HDL 44 02/22/2020   Lab Results  Component Value Date   LDLCALC 124 (H) 06/24/2022   LDLCALC 99 02/22/2020   Lab Results  Component Value Date   TRIG 103 06/24/2022   TRIG 185 (H) 02/22/2020   Lab Results  Component Value Date   CHOLHDL 4.4 06/24/2022   CHOLHDL 4.1 02/22/2020   No results found for: "LDLDIRECT"  Glucose: Glucose  Date Value Ref Range Status  10/10/2014 99 65 - 99 mg/dL Final  30/86/5784 696 (H) 65 - 99 mg/dL Final  29/52/8413 244 (H) 65 - 99 mg/dL Final   Glucose, Bld  Date Value Ref Range Status  08/10/2022 116 (H) 70 - 99 mg/dL Final    Comment:    Glucose reference range applies only to  samples taken after fasting for at least 8 hours.  06/16/2022 129 (H) 70 - 99 mg/dL Final    Comment:    Glucose reference range applies only to samples taken after fasting for at least 8 hours.  11/23/2021 127 (H) 70 - 99 mg/dL Final    Comment:    Glucose reference range applies only to samples taken after fasting for at least 8 hours.    Patient Active Problem List   Diagnosis Date Noted   New onset type 2 diabetes mellitus (HCC) 06/24/2022   Class 1 obesity due to excess calories with serious comorbidity and body mass index (BMI) of 32.0 to 32.9 in adult  06/24/2022   Schizoaffective disorder (HCC) 02/22/2020   Vitamin D deficiency 11/02/2018    No past surgical history on file.  No family history on file.  Social History   Socioeconomic History   Marital status: Single    Spouse name: Not on file   Number of children: Not on file   Years of education: Not on file   Highest education level: Not on file  Occupational History   Not on file  Tobacco Use   Smoking status: Every Day   Smokeless tobacco: Never  Vaping Use   Vaping status: Some Days   Substances: Nicotine  Substance and Sexual Activity   Alcohol use: Not Currently   Drug use: Not Currently   Sexual activity: Not Currently  Other Topics Concern   Not on file  Social History Narrative   Not on file   Social Determinants of Health   Financial Resource Strain: Not on file  Food Insecurity: Not on file  Transportation Needs: Not on file  Physical Activity: Not on file  Stress: Not on file  Social Connections: Not on file  Intimate Partner Violence: Not on file     Current Outpatient Medications:    albuterol (VENTOLIN HFA) 108 (90 Base) MCG/ACT inhaler, Inhale 2 puffs into the lungs every 6 (six) hours as needed for wheezing or shortness of breath., Disp: 8 g, Rfl: 0   benztropine (COGENTIN) 0.5 MG tablet, Take 0.5 tablets (0.25 mg total) by mouth daily., Disp: 30 tablet, Rfl: 1   cephALEXin (KEFLEX) 500 MG capsule, Take 1 capsule (500 mg total) by mouth 3 (three) times daily., Disp: 21 capsule, Rfl: 0   D3 SUPER STRENGTH 50 MCG (2000 UT) CAPS, Take 1 capsule (2,000 Units total) by mouth daily., Disp: 30 capsule, Rfl: 1   ondansetron (ZOFRAN-ODT) 4 MG disintegrating tablet, Take 1 tablet (4 mg total) by mouth every 8 (eight) hours as needed for nausea or vomiting., Disp: 12 tablet, Rfl: 0   risperiDONE (RISPERDAL) 4 MG tablet, Take 1 tablet (4 mg total) by mouth daily., Disp: 30 tablet, Rfl: 1  Allergies  Allergen Reactions   Shellfish Allergy Anaphylaxis      ROS  ***  Objective  There were no vitals filed for this visit.  There is no height or weight on file to calculate BMI.  Physical Exam ***  No results found for this or any previous visit (from the past 2160 hour(s)).   Fall Risk:    06/24/2022    1:29 PM  Fall Risk   Falls in the past year? 0  Number falls in past yr: 0  Injury with Fall? 0  Follow up Falls evaluation completed   ***  Functional Status Survey:   ***  Assessment & Plan  There are no diagnoses linked to  this encounter.  -USPSTF grade A and B recommendations reviewed with patient; age-appropriate recommendations, preventive care, screening tests, etc discussed and encouraged; healthy living encouraged; see AVS for patient education given to patient -Discussed importance of 150 minutes of physical activity weekly, eat two servings of fish weekly, eat one serving of tree nuts ( cashews, pistachios, pecans, almonds.Marland Kitchen) every other day, eat 6 servings of fruit/vegetables daily and drink plenty of water and avoid sweet beverages.   -Reviewed Health Maintenance: {yes ZO:109604}

## 2023-05-17 NOTE — Progress Notes (Deleted)
Name: Clinton Hart   MRN: [id number]    DOB: [birth year]   Date:05/17/2023       Progress Note  Subjective  Chief Complaint  No chief complaint on file.   HPI  Patient presents for annual CPE ***.  IPSS Questionnaire (AUA-7): Over the past month.   1)  How often have you had a sensation of not emptying your bladder completely after you finish urinating?  {Rating:19227}  2)  How often have you had to urinate again less than two hours after you finished urinating? {Rating:19227}  3)  How often have you found you stopped and started again several times when you urinated?  {Rating:19227}  4) How difficult have you found it to postpone urination?  {Rating:19227}  5) How often have you had a weak urinary stream?  {Rating:19227}  6) How often have you had to push or strain to begin urination?  {Rating:19227}  7) How many times did you most typically get up to urinate from the time you went to bed until the time you got up in the morning?  {Rating:19228}  Total score:  0-7 mildly symptomatic   8-19 moderately symptomatic   20-35 severely symptomatic     Diet: *** Exercise: *** Last Dental Exam: **** Last Eye Exam: ***  Depression: phq 9 is {gen pos WUJ:811914}    06/24/2022    1:29 PM  Depression screen PHQ 2/9  Decreased Interest 0  Down, Depressed, Hopeless 1  PHQ - 2 Score 1  Altered sleeping 1  Tired, decreased energy 1  Change in appetite 0  Feeling bad or failure about yourself  0  Trouble concentrating 1  Moving slowly or fidgety/restless 0  Suicidal thoughts 0  PHQ-9 Score 4  Difficult doing work/chores Not difficult at all    Hypertension:  BP Readings from Last 3 Encounters:  03/19/23 125/78  03/04/23 112/77  10/29/22 122/81    Obesity: Wt Readings from Last 3 Encounters:  03/19/23 190 lb (86.2 kg)  03/04/23 190 lb (86.2 kg)  10/29/22 200 lb (90.7 kg)   BMI Readings from Last 3 Encounters:  03/19/23 28.06 kg/m  03/04/23 28.06 kg/m  10/29/22  29.53 kg/m     Lipids:  Lab Results  Component Value Date   CHOL 188 06/24/2022   CHOL 180 02/22/2020   Lab Results  Component Value Date   HDL 43 06/24/2022   HDL 44 02/22/2020   Lab Results  Component Value Date   LDLCALC 124 (H) 06/24/2022   LDLCALC 99 02/22/2020   Lab Results  Component Value Date   TRIG 103 06/24/2022   TRIG 185 (H) 02/22/2020   Lab Results  Component Value Date   CHOLHDL 4.4 06/24/2022   CHOLHDL 4.1 02/22/2020   No results found for: "LDLDIRECT" Glucose:  Glucose  Date Value Ref Range Status  10/10/2014 99 65 - 99 mg/dL Final  78/29/5621 308 (H) 65 - 99 mg/dL Final  65/78/4696 295 (H) 65 - 99 mg/dL Final   Glucose, Bld  Date Value Ref Range Status  08/10/2022 116 (H) 70 - 99 mg/dL Final    Comment:    Glucose reference range applies only to samples taken after fasting for at least 8 hours.  06/16/2022 129 (H) 70 - 99 mg/dL Final    Comment:    Glucose reference range applies only to samples taken after fasting for at least 8 hours.  11/23/2021 127 (H) 70 - 99 mg/dL Final    Comment:  Glucose reference range applies only to samples taken after fasting for at least 8 hours.    Flowsheet Row Office Visit from 06/24/2022 in [facility]  AUDIT-C Score 1      ***  Single STD testing and prevention (HIV/chl/gon/syphilis):  {yes/no/default n/a:21102::"not applicable"} Sexual history:  Hep C Screening:  Skin cancer: Discussed monitoring for atypical lesions Colorectal cancer: *** Prostate cancer:  {yes/no/default n/a:21102::"not applicable"} No results found for: "PSA"   Lung cancer:  Low Dose CT Chest recommended if Age 15-80 years, 30 pack-year currently smoking OR have quit w/in 15years. Patient  {Response; is/is not/na:63} a candidate for screening   AAA: The USPSTF recommends one-time screening with ultrasonography in men ages 13 to 75 years who have ever smoked. Patient   {Response; is /is not/na:63},  a candidate for screening  ECG:  ***  Vaccines:   HPV:  Tdap:  Shingrix:  Pneumonia:  Flu:  COVID-19:  Advanced Care Planning: A voluntary discussion about advance care planning including the explanation and discussion of advance directives.  Discussed health care proxy and Living will, and the patient was able to identify a health care proxy as ***.  Patient {DOES_DOES ZOX:09604} have a living will and power of attorney of health care   Patient Active Problem List   Diagnosis Date Noted   New onset type 2 diabetes mellitus (HCC) 06/24/2022   Class 1 obesity due to excess calories with serious comorbidity and body mass index (BMI) of 32.0 to 32.9 in adult 06/24/2022   Schizoaffective disorder (HCC) 02/22/2020   Vitamin D deficiency 11/02/2018    No past surgical history on file.  No family history on file.  Social History   Socioeconomic History   Marital status: Single    Spouse name: Not on file   Number of children: Not on file   Years of education: Not on file   Highest education level: Not on file  Occupational History   Not on file  Tobacco Use   Smoking status: Every Day   Smokeless tobacco: Never  Vaping Use   Vaping status: Some Days   Substances: Nicotine  Substance and Sexual Activity   Alcohol use: Not Currently   Drug use: Not Currently   Sexual activity: Not Currently  Other Topics Concern   Not on file  Social History Narrative   Not on file   Social Determinants of Health   Financial Resource Strain: Not on file  Food Insecurity: Not on file  Transportation Needs: Not on file  Physical Activity: Not on file  Stress: Not on file  Social Connections: Not on file  Intimate Partner Violence: Not on file     Current Outpatient Medications:    albuterol (VENTOLIN HFA) 108 (90 Base) MCG/ACT inhaler, Inhale 2 puffs into the lungs every 6 (six) hours as needed for wheezing or shortness of breath., Disp: 8 g, Rfl: 0   benztropine (COGENTIN) 0.5  MG tablet, Take 0.5 tablets (0.25 mg total) by mouth daily., Disp: 30 tablet, Rfl: 1   cephALEXin (KEFLEX) 500 MG capsule, Take 1 capsule (500 mg total) by mouth 3 (three) times daily., Disp: 21 capsule, Rfl: 0   D3 SUPER STRENGTH 50 MCG (2000 UT) CAPS, Take 1 capsule (2,000 Units total) by mouth daily., Disp: 30 capsule, Rfl: 1   ondansetron (ZOFRAN-ODT) 4 MG disintegrating tablet, Take 1 tablet (4 mg total) by mouth every 8 (eight) hours as needed for nausea or vomiting., Disp: 12 tablet,  Rfl: 0   risperiDONE (RISPERDAL) 4 MG tablet, Take 1 tablet (4 mg total) by mouth daily., Disp: 30 tablet, Rfl: 1  Allergies  Allergen Reactions   Shellfish Allergy Anaphylaxis     ROS  ***   Objective  There were no vitals filed for this visit.  There is no height or weight on file to calculate BMI.  Physical Exam ***  No results found for this or any previous visit (from the past 2160 hour(s)).   Fall Risk:    06/24/2022    1:29 PM  Fall Risk   Falls in the past year? 0  Number falls in past yr: 0  Injury with Fall? 0  Follow up Falls evaluation completed     Functional Status Survey:      Assessment & Plan  There are no diagnoses linked to this encounter.   -Prostate cancer screening and PSA options (with potential risks and benefits of testing vs not testing) were discussed along with recent recs/guidelines. -USPSTF grade A and B recommendations reviewed with patient; age-appropriate recommendations, preventive care, screening tests, etc discussed and encouraged; healthy living encouraged; see AVS for patient education given to patient -Discussed importance of 150 minutes of physical activity weekly, eat two servings of fish weekly, eat one serving of tree nuts ( cashews, pistachios, pecans, almonds.Marland Kitchen) every other day, eat 6 servings of fruit/vegetables daily and drink plenty of water and avoid sweet beverages.  -Reviewed Health Maintenance: {yes/no/default n/a:21102::"not  applicable"}

## 2023-05-19 ENCOUNTER — Encounter: Payer: 59 | Admitting: Nurse Practitioner

## 2023-05-19 DIAGNOSIS — E782 Mixed hyperlipidemia: Secondary | ICD-10-CM | POA: Insufficient documentation

## 2023-05-28 ENCOUNTER — Ambulatory Visit: Payer: 59

## 2023-06-10 ENCOUNTER — Emergency Department
Admission: EM | Admit: 2023-06-10 | Discharge: 2023-06-10 | Disposition: A | Discharge status: Home / Self Care | Payer: 59 | Attending: Emergency Medicine | Admitting: Emergency Medicine

## 2023-06-10 ENCOUNTER — Emergency Department: Payer: 59

## 2023-06-10 ENCOUNTER — Other Ambulatory Visit: Payer: Self-pay

## 2023-06-10 DIAGNOSIS — E876 Hypokalemia: Secondary | ICD-10-CM | POA: Diagnosis not present

## 2023-06-10 DIAGNOSIS — Z1152 Encounter for screening for COVID-19: Secondary | ICD-10-CM | POA: Insufficient documentation

## 2023-06-10 DIAGNOSIS — R079 Chest pain, unspecified: Secondary | ICD-10-CM

## 2023-06-10 LAB — CBC
HCT: 45.4 % (ref 39.0–52.0)
Hemoglobin: 16 g/dL (ref 13.0–17.0)
MCH: 30.2 pg (ref 26.0–34.0)
MCHC: 35.2 g/dL (ref 30.0–36.0)
MCV: 85.7 fL (ref 80.0–100.0)
Platelets: 282 10*3/uL (ref 150–400)
RBC: 5.3 MIL/uL (ref 4.22–5.81)
RDW: 13.4 % (ref 11.5–15.5)
WBC: 6.1 10*3/uL (ref 4.0–10.5)
nRBC: 0 % (ref 0.0–0.2)

## 2023-06-10 LAB — COMPREHENSIVE METABOLIC PANEL
ALT: 31 U/L (ref 0–44)
AST: 26 U/L (ref 15–41)
Albumin: 4.2 g/dL (ref 3.5–5.0)
Alkaline Phosphatase: 69 U/L (ref 38–126)
Anion gap: 10 (ref 5–15)
BUN: <5 mg/dL — ABNORMAL LOW (ref 6–20)
CO2: 22 mmol/L (ref 22–32)
Calcium: 9.1 mg/dL (ref 8.9–10.3)
Chloride: 105 mmol/L (ref 98–111)
Creatinine, Ser: 0.78 mg/dL (ref 0.61–1.24)
GFR, Estimated: >60 mL/min (ref >60)
Glucose, Bld: 99 mg/dL (ref 70–99)
Potassium: 3.1 mmol/L — ABNORMAL LOW (ref 3.5–5.1)
Sodium: 137 mmol/L (ref 135–145)
Total Bilirubin: 0.6 mg/dL (ref 0.3–1.2)
Total Protein: 7.9 g/dL (ref 6.5–8.1)

## 2023-06-10 LAB — SARS CORONAVIRUS 2 BY RT PCR: SARS Coronavirus 2 by RT PCR: NEGATIVE

## 2023-06-10 LAB — TROPONIN I (HIGH SENSITIVITY): Troponin I (High Sensitivity): 3 ng/L (ref <18)

## 2023-06-10 MED ORDER — LIDOCAINE VISCOUS HCL 2 % MT SOLN
15.0000 mL | Freq: Once | OROMUCOSAL | Status: AC
  Administered 2023-06-10: 15 mL via OROMUCOSAL
  Filled 2023-06-10: qty 15

## 2023-06-10 MED ORDER — ALUM & MAG HYDROXIDE-SIMETH 200-200-20 MG/5ML PO SUSP
30.0000 mL | Freq: Once | ORAL | Status: AC
  Administered 2023-06-10: 30 mL via ORAL
  Filled 2023-06-10: qty 30

## 2023-06-10 NOTE — ED Triage Notes (Signed)
Intermittent substernal cp x 2 days. Hx of same but unsure of results of prior workups. Pt reports no correlation with activity or rest. Denies h/a, dizziness, vision change, n/v. Pt alert and oriented. Ambulatory. Breathing unlabored speaking in full sentences.

## 2023-06-10 NOTE — Discharge Instructions (Addendum)
You were seen in the emergency department today for your chest pain.  Lab workup and EKG looking at your heart was all reassuring at this time.  Laboratory workup did note your potassium was slightly low but otherwise everything else was reassuring.  Suspect this may be reflux or viral upper respiratory infection related.  You can pick up Tums to take over-the-counter to see if they help with symptoms.  Otherwise follow-up with your primary care provider for reassessment within a week.

## 2023-06-10 NOTE — ED Provider Notes (Signed)
[facility] Provider Note    Event Date/Time   First MD Initiated Contact with Patient 06/10/23 2142     (approximate)   History   Chest Pain   HPI Clinton Hart is a 42 y.o. male with schizophrenia presenting today for chest pain.  Patient states having intermittent chest pain symptoms over the past several days.  When asked how to describe it, he includes sharpness, pressure, and burning.  Denies obvious alleviating or aggravating factors.  Has also had chills, congestion, and a cough without production over the past several days.  Otherwise denying shortness of breath, abdominal pain, nausea, vomiting.     Physical Exam   Triage Vital Signs: ED Triage Vitals  Encounter Vitals Group     BP 06/10/23 1933 106/67     Systolic BP Percentile --      Diastolic BP Percentile --      Pulse Rate 06/10/23 1933 (!) 101     Resp 06/10/23 1933 18     Temp 06/10/23 1933 98 F (36.7 C)     Temp Source 06/10/23 1933 Oral     SpO2 06/10/23 1929 98 %     Weight 06/10/23 1934 190 lb (86.2 kg)     Height 06/10/23 1934 5\' 10"  (1.778 m)     Head Circumference --      Peak Flow --      Pain Score 06/10/23 1934 4     Pain Loc --      Pain Education --      Exclude from Growth Chart --     Most recent vital signs: Vitals:   06/10/23 1929 06/10/23 1933  BP:  106/67  Pulse:  (!) 101  Resp:  18  Temp:  98 F (36.7 C)  SpO2: 98% 99%   Physical Exam: I have reviewed the vital signs and nursing notes. General: Awake, alert, no acute distress.  Nontoxic appearing. Head:  Atraumatic, normocephalic.   ENT:  EOM intact, PERRL. Oral mucosa is pink and moist with no lesions. Neck: Neck is supple with full range of motion, No meningeal signs. Cardiovascular:  RRR, No murmurs. Peripheral pulses palpable and equal bilaterally. Respiratory:  Symmetrical chest wall expansion.  No rhonchi, rales, or wheezes.  Good air movement throughout.  No use of accessory muscles.    Musculoskeletal:  No cyanosis or edema. Moving extremities with full ROM Abdomen:  Soft, nontender, nondistended. Neuro:  GCS 15, moving all four extremities, interacting appropriately. Speech clear. Psych:  Calm, appropriate.   Skin:  Warm, dry, no rash.    ED Results / Procedures / Treatments   Labs (all labs ordered are listed, but only abnormal results are displayed) Labs Reviewed  COMPREHENSIVE METABOLIC PANEL - Abnormal; Notable for the following components:      Result Value   Potassium 3.1 (*)    BUN <5 (*)    All other components within normal limits  SARS CORONAVIRUS 2 BY RT PCR  CBC  TROPONIN I (HIGH SENSITIVITY)     EKG My EKG interpretation: Rate of 95, normal sinus rhythm, normal axis, normal intervals.  No acute ST elevations or depressions   RADIOLOGY Independently interpreted chest x-ray with no acute pathology   PROCEDURES:  Critical Care performed: No  Procedures   MEDICATIONS ORDERED IN ED: Medications  alum & mag hydroxide-simeth (MAALOX/MYLANTA) 200-200-20 MG/5ML suspension 30 mL (30 mLs Oral Given 06/10/23 2211)  lidocaine (XYLOCAINE) 2 % viscous mouth solution 15  mL (15 mLs Mouth/Throat Given 06/10/23 2211)     IMPRESSION / MDM / ASSESSMENT AND PLAN / ED COURSE  I reviewed the triage vital signs and the nursing notes.                              Differential diagnosis includes, but is not limited to, reflux, viral URI, gastritis, less likely ACS, pneumonia.  Patient's presentation is most consistent with acute complicated illness / injury requiring diagnostic workup.  Patient is a 42 year old male presenting today for intermittent chest burning symptoms over the past several days which are not currently present at this time.  Otherwise asymptomatic aside from congestion symptoms.  EKG without signs of ischemia and troponin of 3 with low suspicion for ACS.  Laboratory workup only notable for mild hypokalemia but otherwise reassuring.   Patient's vital signs are stable here.  He was given GI cocktail with complete resolution of symptoms.  Suspect he may be a commendation of reflux as well as possible viral URI.  Patient otherwise safe for discharge and told to follow-up with PCP.  Given strict return precautions.      FINAL CLINICAL IMPRESSION(S) / ED DIAGNOSES   Final diagnoses:  Chest pain, unspecified type  Hypokalemia     Rx / DC Orders   ED Discharge Orders     None        Note:  This document was prepared using Dragon voice recognition software and may include unintentional dictation errors.   Janith Lima, MD 06/10/23 2255

## 2023-06-10 NOTE — ED Triage Notes (Signed)
EMS brings pt in from home for c/o CP; denies at present

## 2023-06-17 ENCOUNTER — Ambulatory Visit: Payer: 59 | Admitting: Nurse Practitioner

## 2023-06-17 NOTE — Progress Notes (Deleted)
There were no vitals taken for this visit.   Subjective:    Patient ID: Clinton Hart, male    DOB: 12/03/1980, 42 y.o.   MRN: [id number]  HPI: Clinton Hart is a 42 y.o. male  No chief complaint on file.  ER follow up/ chest pain:patient was seen in er at [facility] for chest pain. Er note: Clinton Hart is a 42 y.o. male with schizophrenia presenting today for chest pain.  Patient states having intermittent chest pain symptoms over the past several days.  When asked how to describe it, he includes sharpness, pressure, and burning.  Denies obvious alleviating or aggravating factors.  Has also had chills, congestion, and a cough without production over the past several days.  Otherwise denying shortness of breath, abdominal pain, nausea, vomiting. EKG performed which was normal sinus, labs done which showed slight hypokalemia.  Seen  on : 06/10/2023 Discharged on: 06/10/2023 Medication changes: none Follow up instructions: follow up with PCP   Patient missed follow up appointment for chronic conditions  Diabetes, Type 2:  -Last A1c 7.2 -Medications: none -Patient is working on lifestyle modification -Checking BG at home: *** -Diet: reduce sugar and processed foods in your diet  -Exercise: recommend 150 min of physical activity weekly   -Eye exam: due -Foot exam: utd -Microalbumin: utd -Statin: none -PNA vaccine: no -Denies symptoms of hypoglycemia, polyuria, polydipsia, numbness extremities, foot ulcers/trauma.     Schizophrenia: He currently gets his mental health care at [facility].  He is currently taking risperidone 4 mg daily and cogentin 0.25 mg daily. He reports he is doing well. No changes    06/24/2022    1:29 PM  Depression screen PHQ 2/9  Decreased Interest 0  Down, Depressed, Hopeless 1  PHQ - 2 Score 1  Altered sleeping 1  Tired, decreased energy 1  Change in appetite 0  Feeling bad or failure about yourself  0  Trouble concentrating 1  Moving  slowly or fidgety/restless 0  Suicidal thoughts 0  PHQ-9 Score 4  Difficult doing work/chores Not difficult at all       06/24/2022    1:29 PM  GAD 7 : Generalized Anxiety Score  Nervous, Anxious, on Edge 0  Control/stop worrying 0  Worry too much - different things 0  Trouble relaxing 0  Restless 0  Easily annoyed or irritable 0  Afraid - awful might happen 0  Total GAD 7 Score 0  Anxiety Difficulty Not difficult at all    Obesity:  Current weight : *** BMI: *** Previous weight:213 lbs Treatment Tried: lifestyle modification Comorbidities: DM, HLD   HLD:  -Medications: none -Patient is working on lifestyle modification -Last lipid panel:  Lipid Panel     Component Value Date/Time   CHOL 188 06/24/2022 1400   TRIG 103 06/24/2022 1400   HDL 43 06/24/2022 1400   CHOLHDL 4.4 06/24/2022 1400   VLDL 37 02/22/2020 1653   LDLCALC 124 (H) 06/24/2022 1400   The 10-year ASCVD risk score (Arnett DK, et al., 2019) is: 7.3%   Values used to calculate the score:     Age: 34 years     Sex: Male     Is Non-Hispanic African American: Yes     Diabetic: Yes     Tobacco smoker: Yes     Systolic Blood Pressure: 106 mmHg     Is BP treated: No     HDL Cholesterol: 43 mg/dL     Total  Cholesterol: 188 mg/dL   Relevant past medical, surgical, family and social history reviewed and updated as indicated. Interim medical history since our last visit reviewed. Allergies and medications reviewed and updated.  Review of Systems  Constitutional: Negative for fever or weight change.  Respiratory: Negative for cough and shortness of breath.   Cardiovascular: Negative for chest pain or palpitations.  Gastrointestinal: Negative for abdominal pain, no bowel changes.  Musculoskeletal: Negative for gait problem or joint swelling.  Skin: Negative for rash.  Neurological: Negative for dizziness or headache.  No other specific complaints in a complete review of systems (except as listed in HPI  above).      Objective:    There were no vitals taken for this visit.  Wt Readings from Last 3 Encounters:  06/10/23 190 lb (86.2 kg)  03/19/23 190 lb (86.2 kg)  03/04/23 190 lb (86.2 kg)    Physical Exam  Constitutional: Patient appears well-developed and well-nourished. Obese *** No distress.  HEENT: head atraumatic, normocephalic, pupils equal and reactive to light, ears ***, neck supple, throat within normal limits Cardiovascular: Normal rate, regular rhythm and normal heart sounds.  No murmur heard. No BLE edema. Pulmonary/Chest: Effort normal and breath sounds normal. No respiratory distress. Abdominal: Soft.  There is no tenderness. Psychiatric: Patient has a normal mood and affect. behavior is normal. Judgment and thought content normal.  Results for orders placed or performed during the hospital encounter of 06/10/23  SARS Coronavirus 2 by RT PCR (hospital order, performed in [facility] hospital lab) *cepheid single result test* Anterior Nasal Swab   Specimen: Anterior Nasal Swab  Result Value Ref Range   SARS Coronavirus 2 by RT PCR NEGATIVE NEGATIVE  CBC  Result Value Ref Range   WBC 6.1 4.0 - 10.5 K/uL   RBC 5.30 4.22 - 5.81 MIL/uL   Hemoglobin 16.0 13.0 - 17.0 g/dL   HCT 81.1 91.4 - 78.2 %   MCV 85.7 80.0 - 100.0 fL   MCH 30.2 26.0 - 34.0 pg   MCHC 35.2 30.0 - 36.0 g/dL   RDW 95.6 21.3 - 08.6 %   Platelets 282 150 - 400 K/uL   nRBC 0.0 0.0 - 0.2 %  Comprehensive metabolic panel  Result Value Ref Range   Sodium 137 135 - 145 mmol/L   Potassium 3.1 (L) 3.5 - 5.1 mmol/L   Chloride 105 98 - 111 mmol/L   CO2 22 22 - 32 mmol/L   Glucose, Bld 99 70 - 99 mg/dL   BUN <5 (L) 6 - 20 mg/dL   Creatinine, Ser 5.78 0.61 - 1.24 mg/dL   Calcium 9.1 8.9 - 46.9 mg/dL   Total Protein 7.9 6.5 - 8.1 g/dL   Albumin 4.2 3.5 - 5.0 g/dL   AST 26 15 - 41 U/L   ALT 31 0 - 44 U/L   Alkaline Phosphatase 69 38 - 126 U/L   Total Bilirubin 0.6 0.3 - 1.2 mg/dL   GFR, Estimated >62 >95  mL/min   Anion gap 10 5 - 15  Troponin I (High Sensitivity)  Result Value Ref Range   Troponin I (High Sensitivity) 3 <18 ng/L      Assessment & Plan:   Problem List Items Addressed This Visit   None    Follow up plan: No follow-ups on file.

## 2023-06-18 ENCOUNTER — Emergency Department
Admission: EM | Admit: 2023-06-18 | Discharge: 2023-06-18 | Disposition: A | Discharge status: Home / Self Care | Payer: 59 | Attending: Emergency Medicine | Admitting: Emergency Medicine

## 2023-06-18 ENCOUNTER — Other Ambulatory Visit: Payer: Self-pay

## 2023-06-18 DIAGNOSIS — R1033 Periumbilical pain: Secondary | ICD-10-CM | POA: Insufficient documentation

## 2023-06-18 DIAGNOSIS — R197 Diarrhea, unspecified: Secondary | ICD-10-CM | POA: Diagnosis not present

## 2023-06-18 DIAGNOSIS — R109 Unspecified abdominal pain: Secondary | ICD-10-CM | POA: Diagnosis present

## 2023-06-18 LAB — URINALYSIS, ROUTINE W REFLEX MICROSCOPIC
Bacteria, UA: NONE SEEN
Bilirubin Urine: NEGATIVE
Glucose, UA: NEGATIVE mg/dL
Ketones, ur: NEGATIVE mg/dL
Leukocytes,Ua: NEGATIVE
Nitrite: NEGATIVE
Protein, ur: NEGATIVE mg/dL
Specific Gravity, Urine: 1.002 — ABNORMAL LOW (ref 1.005–1.030)
Squamous Epithelial / HPF: 0 /[HPF] (ref 0–5)
pH: 7 (ref 5.0–8.0)

## 2023-06-18 LAB — COMPREHENSIVE METABOLIC PANEL
ALT: 25 U/L (ref 0–44)
AST: 22 U/L (ref 15–41)
Albumin: 4 g/dL (ref 3.5–5.0)
Alkaline Phosphatase: 66 U/L (ref 38–126)
Anion gap: 13 (ref 5–15)
BUN: 8 mg/dL (ref 6–20)
CO2: 24 mmol/L (ref 22–32)
Calcium: 9.2 mg/dL (ref 8.9–10.3)
Chloride: 101 mmol/L (ref 98–111)
Creatinine, Ser: 0.82 mg/dL (ref 0.61–1.24)
GFR, Estimated: >60 mL/min (ref >60)
Glucose, Bld: 103 mg/dL — ABNORMAL HIGH (ref 70–99)
Potassium: 3.4 mmol/L — ABNORMAL LOW (ref 3.5–5.1)
Sodium: 138 mmol/L (ref 135–145)
Total Bilirubin: 0.7 mg/dL (ref 0.3–1.2)
Total Protein: 7.4 g/dL (ref 6.5–8.1)

## 2023-06-18 LAB — CBC
HCT: 44.5 % (ref 39.0–52.0)
Hemoglobin: 15.6 g/dL (ref 13.0–17.0)
MCH: 29.9 pg (ref 26.0–34.0)
MCHC: 35.1 g/dL (ref 30.0–36.0)
MCV: 85.2 fL (ref 80.0–100.0)
Platelets: 266 10*3/uL (ref 150–400)
RBC: 5.22 MIL/uL (ref 4.22–5.81)
RDW: 13 % (ref 11.5–15.5)
WBC: 6.1 10*3/uL (ref 4.0–10.5)
nRBC: 0 % (ref 0.0–0.2)

## 2023-06-18 LAB — LIPASE, BLOOD: Lipase: 25 U/L (ref 11–51)

## 2023-06-18 NOTE — ED Provider Notes (Signed)
[facility] Provider Note    Event Date/Time   First MD Initiated Contact with Patient 06/18/23 2206     (approximate)   History   Abdominal Pain   HPI  Clinton Hart is a 42 y.o. male who presents to the emergency department today because of concerns for abdominal pain.  The patient states that the pain started today.  Located around his bellybutton.  It did not leave.  By the time my exam had improved.  This was accompanied by couple episodes of diarrhea.  Both were nonbloody.  He did not have any nausea or vomiting.  Denies any fevers.  No unusual ingestions.     Physical Exam   Triage Vital Signs: ED Triage Vitals  Encounter Vitals Group     BP 06/18/23 1935 111/69     Systolic BP Percentile --      Diastolic BP Percentile --      Pulse Rate 06/18/23 1935 (!) 102     Resp 06/18/23 1935 20     Temp 06/18/23 1935 98.1 F (36.7 C)     Temp src --      SpO2 06/18/23 1935 98 %     Weight 06/18/23 1932 189 lb 9.5 oz (86 kg)     Height 06/18/23 1932 5\' 10"  (1.778 m)     Head Circumference --      Peak Flow --      Pain Score 06/18/23 1936 6     Pain Loc --      Pain Education --      Exclude from Growth Chart --     Most recent vital signs: Vitals:   06/18/23 1935  BP: 111/69  Pulse: (!) 102  Resp: 20  Temp: 98.1 F (36.7 C)  SpO2: 98%   General: Awake, alert, oriented. CV:  Good peripheral perfusion. Regular rate and rhythm. Resp:  Normal effort. Lungs clear. Abd:  No distention. Non tender.   ED Results / Procedures / Treatments   Labs (all labs ordered are listed, but only abnormal results are displayed) Labs Reviewed  COMPREHENSIVE METABOLIC PANEL - Abnormal; Notable for the following components:      Result Value   Potassium 3.4 (*)    Glucose, Bld 103 (*)    All other components within normal limits  URINALYSIS, ROUTINE W REFLEX MICROSCOPIC - Abnormal; Notable for the following components:   Color, Urine STRAW (*)     APPearance CLEAR (*)    Specific Gravity, Urine 1.002 (*)    Hgb urine dipstick MODERATE (*)    All other components within normal limits  LIPASE, BLOOD  CBC     EKG  None   RADIOLOGY None   PROCEDURES:  Critical Care performed: No    MEDICATIONS ORDERED IN ED: Medications - No data to display   IMPRESSION / MDM / ASSESSMENT AND PLAN / ED COURSE  I reviewed the triage vital signs and the nursing notes.                              Differential diagnosis includes, but is not limited to, pancreatitis, appendicitis, hepatitis, gastroenteritis  Patient's presentation is most consistent with acute presentation with potential threat to life or bodily function.  Patient presented to the emergency department today because of concerns for abdominal pain.  Pain had resolved by the time of my exam.  Patient was nontender in  the abdomen.  Patient was afebrile without leukocytosis.  At this time given patient's clinical improvement and reassuring blood work I do not feel any emergent abdominal imaging is necessary.  I discussed this with the patient.  Discussed return precautions.     FINAL CLINICAL IMPRESSION(S) / ED DIAGNOSES   Final diagnoses:  Periumbilical abdominal pain     Note:  This document was prepared using Dragon voice recognition software and may include unintentional dictation errors.    Phineas Semen, MD 06/18/23 2251

## 2023-06-18 NOTE — ED Triage Notes (Addendum)
Pt to ed from home via abd pain x 1 hour ago with some diarrhea.   Vitals for EMS WNL.   Pt is caox4, in no acute distress in triage and ambulatory. Pt was just seen a week ago for CP. Pt has psych HX but denies SI and HI in triage,.

## 2023-06-18 NOTE — Discharge Instructions (Signed)
Please seek medical attention for any high fevers, chest pain, shortness of breath, change in behavior, persistent vomiting, bloody stool or any other new or concerning symptoms.  

## 2023-07-10 ENCOUNTER — Other Ambulatory Visit: Payer: Self-pay

## 2023-07-10 ENCOUNTER — Encounter: Payer: Self-pay | Admitting: Emergency Medicine

## 2023-07-10 ENCOUNTER — Emergency Department
Admission: EM | Admit: 2023-07-10 | Discharge: 2023-07-10 | Disposition: A | Discharge status: Home / Self Care | Payer: 59 | Attending: Emergency Medicine | Admitting: Emergency Medicine

## 2023-07-10 DIAGNOSIS — R0981 Nasal congestion: Secondary | ICD-10-CM | POA: Diagnosis present

## 2023-07-10 DIAGNOSIS — J069 Acute upper respiratory infection, unspecified: Secondary | ICD-10-CM | POA: Diagnosis not present

## 2023-07-10 DIAGNOSIS — Z20822 Contact with and (suspected) exposure to covid-19: Secondary | ICD-10-CM | POA: Insufficient documentation

## 2023-07-10 LAB — RESP PANEL BY RT-PCR (RSV, FLU A&B, COVID)  RVPGX2
Influenza A by PCR: NEGATIVE
Influenza B by PCR: NEGATIVE
Resp Syncytial Virus by PCR: NEGATIVE
SARS Coronavirus 2 by RT PCR: NEGATIVE

## 2023-07-10 NOTE — ED Provider Notes (Signed)
   [facility] Provider Note    Event Date/Time   First MD Initiated Contact with Patient 07/10/23 [phone]     (approximate)   History   Nasal Congestion   HPI  Clinton Hart is a 42 y.o. male with history of schizophrenia who presents with complaints of nasal congestion and occasional cough over the last 2 days.  No fevers reported.  No shortness of breath.  No other complaints     Physical Exam   Triage Vital Signs: ED Triage Vitals  Encounter Vitals Group     BP 07/10/23 0857 124/78     Systolic BP Percentile --      Diastolic BP Percentile --      Pulse Rate 07/10/23 0857 97     Resp 07/10/23 0857 20     Temp 07/10/23 0857 98.3 F (36.8 C)     Temp src --      SpO2 07/10/23 0857 99 %     Weight 07/10/23 0856 86.2 kg (190 lb)     Height 07/10/23 0856 1.753 m (5\' 9" )     Head Circumference --      Peak Flow --      Pain Score 07/10/23 0855 7     Pain Loc --      Pain Education --      Exclude from Growth Chart --     Most recent vital signs: Vitals:   07/10/23 0857  BP: 124/78  Pulse: 97  Resp: 20  Temp: 98.3 F (36.8 C)  SpO2: 99%     General: Awake, no distress.  Well-appearing, CV:  Good peripheral perfusion.  Resp:  Normal effort.  There are auscultation bilaterally Abd:  No distention.  Other:     ED Results / Procedures / Treatments   Labs (all labs ordered are listed, but only abnormal results are displayed) Labs Reviewed  RESP PANEL BY RT-PCR (RSV, FLU A&B, COVID)  RVPGX2     EKG     RADIOLOGY     PROCEDURES:  Critical Care performed:   Procedures   MEDICATIONS ORDERED IN ED: Medications - No data to display   IMPRESSION / MDM / ASSESSMENT AND PLAN / ED COURSE  I reviewed the triage vital signs and the nursing notes. Patient's presentation is most consistent with acute, uncomplicated illness.  Patient presents with nasal congestion, cough as above, consistent with viral upper respiratory  infection, recommend supportive care, outpatient follow-up as needed        FINAL CLINICAL IMPRESSION(S) / ED DIAGNOSES   Final diagnoses:  Viral upper respiratory tract infection     Rx / DC Orders   ED Discharge Orders     None        Note:  This document was prepared using Dragon voice recognition software and may include unintentional dictation errors.   Jene Every, MD 07/10/23 [phone]

## 2023-07-10 NOTE — ED Triage Notes (Signed)
Pt via [facility] from home. Pt c/o nasal congestion, fever, and cough for the past couple of days denies any sick contacts. Pt is A&Ox4 and NAD

## 2023-07-10 NOTE — ED Triage Notes (Signed)
Pt in via EMS from home with complaints of fever for the last few days. 98.1 temp, 121/74, 105HR, 10% RA. Pt has not taken any meds this am. Pt with hx of schizophrenia.

## 2023-07-29 ENCOUNTER — Emergency Department
Admission: EM | Admit: 2023-07-29 | Discharge: 2023-07-29 | Disposition: A | Discharge status: Home / Self Care | Payer: 59 | Attending: Emergency Medicine | Admitting: Emergency Medicine

## 2023-07-29 ENCOUNTER — Other Ambulatory Visit: Payer: Self-pay

## 2023-07-29 DIAGNOSIS — Z20822 Contact with and (suspected) exposure to covid-19: Secondary | ICD-10-CM | POA: Diagnosis not present

## 2023-07-29 DIAGNOSIS — J069 Acute upper respiratory infection, unspecified: Secondary | ICD-10-CM | POA: Diagnosis not present

## 2023-07-29 DIAGNOSIS — R0981 Nasal congestion: Secondary | ICD-10-CM | POA: Diagnosis present

## 2023-07-29 LAB — RESP PANEL BY RT-PCR (RSV, FLU A&B, COVID)  RVPGX2
Influenza A by PCR: NEGATIVE
Influenza B by PCR: NEGATIVE
Resp Syncytial Virus by PCR: NEGATIVE
SARS Coronavirus 2 by RT PCR: NEGATIVE

## 2023-07-29 LAB — GROUP A STREP BY PCR: Group A Strep by PCR: NOT DETECTED

## 2023-07-29 MED ORDER — ACETAMINOPHEN 500 MG PO TABS
1000.0000 mg | ORAL_TABLET | Freq: Once | ORAL | Status: AC
  Administered 2023-07-29: 1000 mg via ORAL
  Filled 2023-07-29: qty 2

## 2023-07-29 NOTE — ED Provider Notes (Signed)
[facility] Provider Note    Event Date/Time   First MD Initiated Contact with Patient 07/29/23 1715     (approximate)   History   Nasal Congestion   HPI  Clinton Hart is a 42 y.o. male with a history of schizophrenia who comes in with nasal congestion.  Who comes in with nasal congestion.  Patient was seen on 07/10/2023 for similar.  Patient reports that has been compliant with all of his schizophrenia medications.  He states that he has had some nasal congestion and sore throat for 1 day.  He denies taking anything to try to help with the symptoms.  He reports that the throat pain is only mild and he still able to tolerate eating and drinking well.  Denies any ear pain.  He denies any shortness of breath, coughing, abdominal pain, urinary symptoms.  He reports that mentally he has been doing well and denies any SI.    Physical Exam   Triage Vital Signs: ED Triage Vitals  Encounter Vitals Group     BP 07/29/23 1439 123/75     Systolic BP Percentile --      Diastolic BP Percentile --      Pulse Rate 07/29/23 1439 98     Resp 07/29/23 1439 16     Temp 07/29/23 1439 98 F (36.7 C)     Temp Source 07/29/23 1439 Oral     SpO2 07/29/23 1439 98 %     Weight 07/29/23 1440 190 lb (86.2 kg)     Height 07/29/23 1440 5\' 9"  (1.753 m)     Head Circumference --      Peak Flow --      Pain Score 07/29/23 1439 6     Pain Loc --      Pain Education --      Exclude from Growth Chart --     Most recent vital signs: Vitals:   07/29/23 1439  BP: 123/75  Pulse: 98  Resp: 16  Temp: 98 F (36.7 C)  SpO2: 98%     General: Awake, no distress.  CV:  Good peripheral perfusion.  Resp:  Normal effort.  Abd:  No distention.  Other:  Oral pharynx with some slight erythema but no exudates noted.  Uvula midline.  Full range of motion of neck.   ED Results / Procedures / Treatments   Labs (all labs ordered are listed, but only abnormal results are  displayed) Labs Reviewed  RESP PANEL BY RT-PCR (RSV, FLU A&B, COVID)  RVPGX2  GROUP A STREP BY PCR     PROCEDURES:  Critical Care performed: No  Procedures   MEDICATIONS ORDERED IN ED: Medications - No data to display   IMPRESSION / MDM / ASSESSMENT AND PLAN / ED COURSE  I reviewed the triage vital signs and the nursing notes.   Patient's presentation is most consistent with acute, uncomplicated illness.   Differential is most likely viral versus strep versus COVID, flu, RSV.  No cough and or shortness of breath and oxygen level 98% unlikely pneumonia.  Patient is COVID, flu, RSV are negative.  Strep testing is pending.  Will call patient if this is positive.  Will give patient Tylenol for pain.  Patient feels comfortable with this plan and we discussed symptomatic treatment with Flonase, Tylenol, ibuprofen.       FINAL CLINICAL IMPRESSION(S) / ED DIAGNOSES   Final diagnoses:  Viral upper respiratory tract infection  Rx / DC Orders   ED Discharge Orders     None        Note:  This document was prepared using Dragon voice recognition software and may include unintentional dictation errors.   Concha Se, MD 07/29/23 1725

## 2023-07-29 NOTE — ED Notes (Signed)
Dc instructions reviewed with pt no questions or concerns at this time. EDP will notify pt if strep is positive.

## 2023-07-29 NOTE — Discharge Instructions (Addendum)
You can take Tylenol 1 g every 8 hours, ibuprofen 400 every 8 hours with food.  Use Flonase over-the-counter to help with your nose.  Your COVID and flu was negative.  Keep your phone on you and on volume as we will call you if your strep test is positive or you can follow-up in MyChart.  Return to the ER for worsening symptoms or any other concerns

## 2023-07-29 NOTE — ED Triage Notes (Signed)
Pt c/o nasal congestion that started today, pt called EMS. Pt denies any other symptoms. Pt takes medication for schizophrenia

## 2023-08-16 ENCOUNTER — Other Ambulatory Visit: Payer: Self-pay

## 2023-08-16 ENCOUNTER — Emergency Department
Admission: EM | Admit: 2023-08-16 | Discharge: 2023-08-16 | Disposition: A | Discharge status: Home / Self Care | Payer: 59 | Attending: Student in an Organized Health Care Education/Training Program | Admitting: Student in an Organized Health Care Education/Training Program

## 2023-08-16 ENCOUNTER — Encounter: Payer: Self-pay | Admitting: Emergency Medicine

## 2023-08-16 DIAGNOSIS — Z1152 Encounter for screening for COVID-19: Secondary | ICD-10-CM | POA: Insufficient documentation

## 2023-08-16 DIAGNOSIS — B349 Viral infection, unspecified: Secondary | ICD-10-CM | POA: Diagnosis not present

## 2023-08-16 DIAGNOSIS — J029 Acute pharyngitis, unspecified: Secondary | ICD-10-CM | POA: Diagnosis present

## 2023-08-16 LAB — RESP PANEL BY RT-PCR (RSV, FLU A&B, COVID)  RVPGX2
Influenza A by PCR: NEGATIVE
Influenza B by PCR: NEGATIVE
Resp Syncytial Virus by PCR: NEGATIVE
SARS Coronavirus 2 by RT PCR: NEGATIVE

## 2023-08-16 LAB — GROUP A STREP BY PCR: Group A Strep by PCR: NOT DETECTED

## 2023-08-16 MED ORDER — ACETAMINOPHEN 325 MG PO TABS
650.0000 mg | ORAL_TABLET | Freq: Once | ORAL | Status: AC
  Administered 2023-08-16: 650 mg via ORAL
  Filled 2023-08-16: qty 2

## 2023-08-16 MED ORDER — IBUPROFEN 600 MG PO TABS
600.0000 mg | ORAL_TABLET | Freq: Once | ORAL | Status: AC
  Administered 2023-08-16: 600 mg via ORAL
  Filled 2023-08-16: qty 1

## 2023-08-16 NOTE — ED Provider Notes (Signed)
[facility] Provider Note    Event Date/Time   First MD Initiated Contact with Patient 08/16/23 1600     (approximate)   History   Sore Throat   HPI  Clinton Hart is a 42 y.o. male who presents today for evaluation of cough, nasal congestion, body aches for the past 2 days.  Patient is requesting to be tested for the flu.  He denies any known sick contacts.  He denies chest pain, shortness of breath, abdominal pain, nausea, vomiting, diarrhea.  No headache or neck pain.  No recent trips or travel.  Patient Active Problem List   Diagnosis Date Noted   Mixed hyperlipidemia 05/19/2023   New onset type 2 diabetes mellitus (HCC) 06/24/2022   Class 1 obesity due to excess calories with serious comorbidity and body mass index (BMI) of 32.0 to 32.9 in adult 06/24/2022   Schizoaffective disorder (HCC) 02/22/2020   Vitamin D deficiency 11/02/2018          Physical Exam   Triage Vital Signs: ED Triage Vitals  Encounter Vitals Group     BP 08/16/23 1533 106/66     Systolic BP Percentile --      Diastolic BP Percentile --      Pulse Rate 08/16/23 1533 79     Resp 08/16/23 1533 20     Temp 08/16/23 1533 98.4 F (36.9 C)     Temp Source 08/16/23 1533 Oral     SpO2 08/16/23 1533 100 %     Weight 08/16/23 1535 190 lb (86.2 kg)     Height 08/16/23 1535 5\' 10"  (1.778 m)     Head Circumference --      Peak Flow --      Pain Score 08/16/23 1535 6     Pain Loc --      Pain Education --      Exclude from Growth Chart --     Most recent vital signs: Vitals:   08/16/23 1533 08/16/23 1701  BP: 106/66 128/83  Pulse: 79 69  Resp: 20 18  Temp: 98.4 F (36.9 C)   SpO2: 100% 100%    Physical Exam Vitals and nursing note reviewed.  Constitutional:      General: Awake and alert. No acute distress.    Appearance: Normal appearance. The patient is normal weight.  HENT:     Head: Normocephalic and atraumatic.     Mouth: Mucous membranes are moist.  Uvula midline.  No tonsillar exudate.  No soft palate fluctuance.  No trismus.  No voice change.  No sublingual swelling.  No tender cervical lymphadenopathy.  No nuchal rigidity Eyes:     General: PERRL. Normal EOMs        Right eye: No discharge.        Left eye: No discharge.     Conjunctiva/sclera: Conjunctivae normal.  Cardiovascular:     Rate and Rhythm: Normal rate and regular rhythm.     Pulses: Normal pulses.  Pulmonary:     Effort: Pulmonary effort is normal. No respiratory distress.     Breath sounds: Normal breath sounds.  Abdominal:     Abdomen is soft. There is no abdominal tenderness. No rebound or guarding. No distention. Musculoskeletal:        General: No swelling. Normal range of motion.     Cervical back: Normal range of motion and neck supple.  No lymphadenopathy Skin:    General: Skin is warm and dry.  Capillary Refill: Capillary refill takes less than 2 seconds.     Findings: No rash.  Neurological:     Mental Status: The patient is awake and alert.      ED Results / Procedures / Treatments   Labs (all labs ordered are listed, but only abnormal results are displayed) Labs Reviewed  RESP PANEL BY RT-PCR (RSV, FLU A&B, COVID)  RVPGX2  GROUP A STREP BY PCR     EKG     RADIOLOGY     PROCEDURES:  Critical Care performed:   Procedures   MEDICATIONS ORDERED IN ED: Medications  acetaminophen (TYLENOL) tablet 650 mg (650 mg Oral Given 08/16/23 1659)  ibuprofen (ADVIL) tablet 600 mg (600 mg Oral Given 08/16/23 1659)     IMPRESSION / MDM / ASSESSMENT AND PLAN / ED COURSE  I reviewed the triage vital signs and the nursing notes.   Differential diagnosis includes, but is not limited to, influenza, COVID-19, RSV, other viral etiology.  Patient is awake and alert, hemodynamically stable and afebrile.  He is nontoxic in appearance.  Swabs obtained in triage are negative.  Patient was treated symptomatically with Tylenol and ibuprofen.  He  is reassured by these findings.  Symptoms sound very much like respiratory symptoms. No evidence of peritonsillar abscess, uvula midline, normal voice, no trouble swallowing.  No urinary symptoms, no CVAT tenderness, do not suspect Uti/pyelo. No abdominal pain, do not suspect intraabdominal process. No N/V/D, do not suspect gastroenteritis or leptospirosis. No headache or neck pain, do not suspect meningitis. Strict return precautions were also discussed. Patient understands and agrees with plan. Patient is hemodynamically stable, demonstrates no increased work of breathing, and is safe for discharge.  Patient is in agreement with plan of care.  Understands return precautions.   Patient's presentation is most consistent with acute complicated illness / injury requiring diagnostic workup.     FINAL CLINICAL IMPRESSION(S) / ED DIAGNOSES   Final diagnoses:  Viral syndrome     Rx / DC Orders   ED Discharge Orders     None        Note:  This document was prepared using Dragon voice recognition software and may include unintentional dictation errors.   Keturah Shavers 08/16/23 1857    Willy Eddy, MD 08/16/23 2236

## 2023-08-16 NOTE — Discharge Instructions (Signed)
Your swabs are negative for COVID, flu, RSV, and strep.  You may continue to take Tylenol/ibuprofen per package instructions as needed for your symptoms.  Please return for any new, worsening, or change in symptoms or other concerns.  It was a pleasure caring for you today.

## 2023-08-16 NOTE — ED Triage Notes (Signed)
Patient to ED via POV fro sore throat and body aches x3 days. NAD noted.

## 2023-08-19 ENCOUNTER — Other Ambulatory Visit: Payer: Self-pay

## 2023-08-19 ENCOUNTER — Emergency Department: Payer: 59

## 2023-08-19 ENCOUNTER — Encounter: Payer: Self-pay | Admitting: Emergency Medicine

## 2023-08-19 ENCOUNTER — Emergency Department
Admission: EM | Admit: 2023-08-19 | Discharge: 2023-08-19 | Disposition: A | Discharge status: Home / Self Care | Payer: 59 | Attending: Emergency Medicine | Admitting: Emergency Medicine

## 2023-08-19 DIAGNOSIS — R0602 Shortness of breath: Secondary | ICD-10-CM | POA: Insufficient documentation

## 2023-08-19 DIAGNOSIS — Z5321 Procedure and treatment not carried out due to patient leaving prior to being seen by health care provider: Secondary | ICD-10-CM | POA: Diagnosis not present

## 2023-08-19 HISTORY — DX: Unspecified asthma, uncomplicated: J45.909

## 2023-08-19 LAB — CBC
HCT: 44.4 % (ref 39.0–52.0)
Hemoglobin: 15.5 g/dL (ref 13.0–17.0)
MCH: 30.3 pg (ref 26.0–34.0)
MCHC: 34.9 g/dL (ref 30.0–36.0)
MCV: 86.9 fL (ref 80.0–100.0)
Platelets: 287 10*3/uL (ref 150–400)
RBC: 5.11 MIL/uL (ref 4.22–5.81)
RDW: 13.3 % (ref 11.5–15.5)
WBC: 8.6 10*3/uL (ref 4.0–10.5)
nRBC: 0 % (ref 0.0–0.2)

## 2023-08-19 LAB — BASIC METABOLIC PANEL
Anion gap: 10 (ref 5–15)
BUN: 9 mg/dL (ref 6–20)
CO2: 26 mmol/L (ref 22–32)
Calcium: 9.3 mg/dL (ref 8.9–10.3)
Chloride: 101 mmol/L (ref 98–111)
Creatinine, Ser: 1 mg/dL (ref 0.61–1.24)
GFR, Estimated: >60 mL/min (ref >60)
Glucose, Bld: 110 mg/dL — ABNORMAL HIGH (ref 70–99)
Potassium: 3.8 mmol/L (ref 3.5–5.1)
Sodium: 137 mmol/L (ref 135–145)

## 2023-08-19 NOTE — ED Triage Notes (Signed)
Pt presents to ER via EMS, reports he was meditating and felt shortness of breath. Pt at present reports no longer feeling short of breath. Pt reports he does have a hx of asthma, denies using his inhaler today. Pt talks in complete sentences no respiratory distress noted.

## 2023-09-21 ENCOUNTER — Other Ambulatory Visit: Payer: Self-pay

## 2023-09-21 ENCOUNTER — Emergency Department
Admission: EM | Admit: 2023-09-21 | Discharge: 2023-09-21 | Disposition: A | Discharge status: Home / Self Care | Payer: 59 | Attending: Emergency Medicine | Admitting: Emergency Medicine

## 2023-09-21 DIAGNOSIS — J029 Acute pharyngitis, unspecified: Secondary | ICD-10-CM | POA: Diagnosis present

## 2023-09-21 DIAGNOSIS — E119 Type 2 diabetes mellitus without complications: Secondary | ICD-10-CM | POA: Diagnosis not present

## 2023-09-21 DIAGNOSIS — U071 COVID-19: Secondary | ICD-10-CM | POA: Diagnosis not present

## 2023-09-21 LAB — GROUP A STREP BY PCR: Group A Strep by PCR: NOT DETECTED

## 2023-09-21 LAB — RESP PANEL BY RT-PCR (RSV, FLU A&B, COVID)  RVPGX2
Influenza A by PCR: NEGATIVE
Influenza B by PCR: NEGATIVE
Resp Syncytial Virus by PCR: NEGATIVE
SARS Coronavirus 2 by RT PCR: POSITIVE — AB

## 2023-09-21 NOTE — Discharge Instructions (Signed)
 Your swab was positive for COVID-19.  Please isolate from others unless you require medical attention as this is highly contagious to others.  You may take Tylenol /ibuprofen  per package instructions as needed for your symptoms.  It was a pleasure caring for you today.

## 2023-09-21 NOTE — ED Provider Notes (Signed)
 [facility] Provider Note    Event Date/Time   First MD Initiated Contact with Patient 09/21/23 [phone]     (approximate)   History   flu sx   HPI  Clinton Hart is a 42 y.o. male with a past medical history of diabetes, hyperlipidemia, obesity, schizoaffective disorder who presents today for evaluation of bodyaches, congestion, sore throat, and runny nose for the past 3 days.  He is unsure of any sick contacts.  Denies chest pain or shortness of breath.  No abdominal pain, nausea, vomiting, diarrhea.  Patient Active Problem List   Diagnosis Date Noted   Mixed hyperlipidemia 05/19/2023   New onset type 2 diabetes mellitus (HCC) 06/24/2022   Class 1 obesity due to excess calories with serious comorbidity and body mass index (BMI) of 32.0 to 32.9 in adult 06/24/2022   Schizoaffective disorder (HCC) 02/22/2020   Vitamin D  deficiency 11/02/2018          Physical Exam   Triage Vital Signs: ED Triage Vitals  Encounter Vitals Group     BP 09/21/23 0806 114/82     Systolic BP Percentile --      Diastolic BP Percentile --      Pulse Rate 09/21/23 0808 89     Resp 09/21/23 0806 17     Temp 09/21/23 0806 99.1 F (37.3 C)     Temp src --      SpO2 09/21/23 0808 95 %     Weight 09/21/23 0806 190 lb (86.2 kg)     Height 09/21/23 0806 5' 10 (1.778 m)     Head Circumference --      Peak Flow --      Pain Score 09/21/23 0806 6     Pain Loc --      Pain Education --      Exclude from Growth Chart --     Most recent vital signs: Vitals:   09/21/23 0806 09/21/23 0808  BP: 114/82   Pulse:  89  Resp: 17   Temp: 99.1 F (37.3 C)   SpO2:  95%    Physical Exam Vitals and nursing note reviewed.  Constitutional:      General: Awake and alert. No acute distress.    Appearance: Normal appearance. The patient is normal weight.  HENT:     Head: Normocephalic and atraumatic.     Mouth: Mucous membranes are moist.  Nasal congestion present Eyes:      General: PERRL. Normal EOMs        Right eye: No discharge.        Left eye: No discharge.     Conjunctiva/sclera: Conjunctivae normal.  Cardiovascular:     Rate and Rhythm: Normal rate and regular rhythm.     Pulses: Normal pulses.  Pulmonary:     Effort: Pulmonary effort is normal. No respiratory distress.     Breath sounds: Normal breath sounds.  Abdominal:     Abdomen is soft. There is no abdominal tenderness. No rebound or guarding. No distention. Musculoskeletal:        General: No swelling. Normal range of motion.     Cervical back: Normal range of motion and neck supple.  Skin:    General: Skin is warm and dry.     Capillary Refill: Capillary refill takes less than 2 seconds.     Findings: No rash.  Neurological:     Mental Status: The patient is awake and alert.  ED Results / Procedures / Treatments   Labs (all labs ordered are listed, but only abnormal results are displayed) Labs Reviewed  RESP PANEL BY RT-PCR (RSV, FLU A&B, COVID)  RVPGX2 - Abnormal; Notable for the following components:      Result Value   SARS Coronavirus 2 by RT PCR POSITIVE (*)    All other components within normal limits  GROUP A STREP BY PCR     EKG     RADIOLOGY     PROCEDURES:  Critical Care performed:   Procedures   MEDICATIONS ORDERED IN ED: Medications - No data to display   IMPRESSION / MDM / ASSESSMENT AND PLAN / ED COURSE  I reviewed the triage vital signs and the nursing notes.   Differential diagnosis includes, but is not limited to, COVID, flu, RSV, bronchitis, pneumonia.  Patient is awake and alert, hemodynamically stable and afebrile.  He has normal oxygen saturation on room air and demonstrates no increased work of breathing.  He is nontoxic in appearance.  Swab obtained in triage is positive for COVID-19.  I discussed this diagnosis with the patient.  We discussed symptomatic management and return precautions.  Also discussed that he is highly  contagious to others and we discussed isolation instructions unless he requires medical attention.  Patient understands and agrees with plan.  He was discharged in stable condition.   Patient's presentation is most consistent with acute complicated illness / injury requiring diagnostic workup.    FINAL CLINICAL IMPRESSION(S) / ED DIAGNOSES   Final diagnoses:  COVID-19     Rx / DC Orders   ED Discharge Orders     None        Note:  This document was prepared using Dragon voice recognition software and may include unintentional dictation errors.   Dreamer Carillo E, PA-C 09/21/23 [id number]    Suzanne Kirsch, MD 09/21/23 [phone]

## 2023-09-21 NOTE — ED Provider Triage Note (Signed)
 Emergency Medicine Provider Triage Evaluation Note  Clinton Hart , a 42 y.o. male  was evaluated in triage.  Pt complains of aches, congestion, sore throat, runny nose x 2-3 days. Denies sick contacts. No CP/SOB. No abd pain, n/v/d.   Review of Systems  Positive: Cough, congestion Negative: fever  Physical Exam  There were no vitals taken for this visit. Gen:   Awake, no distress   Resp:  Normal effort  MSK:   Moves extremities without difficulty  Other:    Medical Decision Making  Medically screening exam initiated at 8:05 AM.  Appropriate orders placed.  Clinton Hart was informed that the remainder of the evaluation will be completed by another provider, this initial triage assessment does not replace that evaluation, and the importance of remaining in the ED until their evaluation is complete.     Marquitta Persichetti E, PA-C 09/21/23 [id number]

## 2023-09-21 NOTE — ED Triage Notes (Signed)
Pt to ED [facility] from home for flu like sx x2 days. NAD noted.

## 2023-09-24 ENCOUNTER — Encounter: Payer: 59 | Admitting: Nurse Practitioner

## 2023-09-25 ENCOUNTER — Encounter: Payer: Self-pay | Admitting: Emergency Medicine

## 2023-09-25 ENCOUNTER — Other Ambulatory Visit: Payer: Self-pay

## 2023-09-25 ENCOUNTER — Emergency Department
Admission: EM | Admit: 2023-09-25 | Discharge: 2023-09-25 | Disposition: A | Discharge status: Home / Self Care | Payer: 59 | Attending: Student | Admitting: Student

## 2023-09-25 DIAGNOSIS — R2231 Localized swelling, mass and lump, right upper limb: Secondary | ICD-10-CM | POA: Diagnosis present

## 2023-09-25 DIAGNOSIS — L03011 Cellulitis of right finger: Secondary | ICD-10-CM | POA: Diagnosis not present

## 2023-09-25 MED ORDER — DICLOXACILLIN SODIUM 250 MG PO CAPS: 250.0000 mg | ORAL_CAPSULE | Freq: Four times a day (QID) | ORAL | 0 refills | Status: DC

## 2023-09-25 NOTE — Progress Notes (Signed)
 Rescheduled

## 2023-09-25 NOTE — Discharge Instructions (Signed)
 You have been diagnosed with paronychia or infection in the finger.  Please take the antibiotic as directed.  No submerge the right hand in water.  Please come back if you have any new  symptoms or worsening symptoms

## 2023-09-25 NOTE — ED Provider Notes (Addendum)
 [facility] Provider Note    None    (approximate)   History   Finger Swelling   HPI  Clinton Hart is a 43 y.o. male who presents today with history of stooling in the right second finger.  Patient denies fever      Physical Exam   Triage Vital Signs: ED Triage Vitals  Encounter Vitals Group     BP 09/25/23 1712 134/80     Systolic BP Percentile --      Diastolic BP Percentile --      Pulse Rate 09/25/23 1712 85     Resp 09/25/23 1712 18     Temp 09/25/23 1712 98.5 F (36.9 C)     Temp Source 09/25/23 1712 Oral     SpO2 09/25/23 1712 99 %     Weight 09/25/23 1713 190 lb (86.2 kg)     Height 09/25/23 1713 5' 10 (1.778 m)     Head Circumference --      Peak Flow --      Pain Score 09/25/23 1713 9     Pain Loc --      Pain Education --      Exclude from Growth Chart --     Most recent vital signs: Vitals:   09/25/23 1712  BP: 134/80  Pulse: 85  Resp: 18  Temp: 98.5 F (36.9 C)  SpO2: 99%     Constitutional: Alert  Eyes: Conjunctivae are normal.  Head: Atraumatic. Nose: No congestion/rhinnorhea. Mouth/Throat: Mucous membranes are moist.   Neck: Painless ROM.  Cardiovascular:   Good peripheral circulation. Respiratory: Normal respiratory effort.  No retractions.  Gastrointestinal: Soft and nontender.  Musculoskeletal:  no deformity. Right hand finger distal phalange, presence of abscess in the periungual   area Neurologic:  MAE spontaneously. No gross focal neurologic deficits are appreciated.  Skin:  Skin is warm, dry and intact. No rash noted. Psychiatric: Mood and affect are normal. Speech and behavior are normal.    ED Results / Procedures / Treatments   Labs (all labs ordered are listed, but only abnormal results are displayed) Labs Reviewed - No data to display   EKG       PROCEDURES:  Critical Care performed:   .Incision and Drainage  Date/Time: 09/25/2023 7:55 PM  Performed by: Janit Kast, PA-C Authorized by: Janit Kast, PA-C   Consent:    Consent obtained:  Verbal   Risks, benefits, and alternatives were discussed: yes   Universal protocol:    Procedure explained and questions answered to patient or proxy's satisfaction: yes     Patient identity confirmed:  Verbally with patient Location:    Type:  Abscess   Location:  Upper extremity   Upper extremity location:  Finger   Finger location:  R index finger Pre-procedure details:    Skin preparation:  Antiseptic wash Sedation:    Sedation type:  None Anesthesia:    Anesthesia method:  None Procedure type:    Complexity:  Simple Procedure details:    Ultrasound guidance: no     Needle aspiration: no     Incision types:  Stab incision   Drainage:  Purulent   Drainage amount:  Scant   Wound treatment:  Wound left open Post-procedure details:    Procedure completion:  Tolerated well, no immediate complications    MEDICATIONS ORDERED IN ED: Medications - No data to display   IMPRESSION / MDM / ASSESSMENT AND PLAN / ED COURSE  I reviewed the triage vital signs and the nursing notes.  Differential diagnosis includes, but is not limited to, abscess, cellulitis, foreign body  Patient's presentation is most consistent with acute, uncomplicated illness.   Patient's diagnosis is consistent with abscess of the second right finger. . I did review the patient's allergies and medications. Patient will be discharged home with prescriptions for amoxicillin/clavulanate. Patient is to follow up with PCP as needed or otherwise directed. Patient is given ED precautions to return to the ED for any worsening or new symptoms. Discussed plan of care with patient, answered all of patient's questions, Patient agreeable to plan of care. Advised patient to take medications according to the instructions on the label. Discussed possible side effects of new medications. Patient verbalized understanding.     FINAL  CLINICAL IMPRESSION(S) / ED DIAGNOSES   Final diagnoses:  Paronychia of finger of right hand     Rx / DC Orders   ED Discharge Orders          Ordered    dicloxacillin  (DYNAPEN ) 250 MG capsule  4 times daily        09/25/23 1911             Note:  This document was prepared using Dragon voice recognition software and may include unintentional dictation errors.   Janit Kast, PA-C 09/25/23 1912    Levander Slate, MD 09/25/23 1917    Janit Kast, PA-C 09/25/23 1956    Levander Slate, MD 09/26/23 2006

## 2023-09-25 NOTE — ED Notes (Signed)
 Pt has R index finger swelling next to fingernail since about 3 days ago. No redness or drainage. States area is tender.

## 2023-09-25 NOTE — ED Triage Notes (Signed)
 Patient to ED via POV for right pointer finger swelling around nail. Ongoing for a couple days. No known injury.

## 2023-09-28 ENCOUNTER — Encounter: Payer: Self-pay | Admitting: Nurse Practitioner

## 2023-09-28 ENCOUNTER — Ambulatory Visit (INDEPENDENT_AMBULATORY_CARE_PROVIDER_SITE_OTHER): Payer: 59 | Admitting: Nurse Practitioner

## 2023-09-28 VITALS — BP 110/78 | HR 111 | Temp 98.5°F | Resp 16 | Ht 70.0 in | Wt 187.0 lb

## 2023-09-28 DIAGNOSIS — R7301 Impaired fasting glucose: Secondary | ICD-10-CM

## 2023-09-28 DIAGNOSIS — E782 Mixed hyperlipidemia: Secondary | ICD-10-CM | POA: Diagnosis not present

## 2023-09-28 DIAGNOSIS — F251 Schizoaffective disorder, depressive type: Secondary | ICD-10-CM

## 2023-09-28 DIAGNOSIS — R5383 Other fatigue: Secondary | ICD-10-CM

## 2023-09-28 DIAGNOSIS — E559 Vitamin D deficiency, unspecified: Secondary | ICD-10-CM

## 2023-09-28 DIAGNOSIS — E538 Deficiency of other specified B group vitamins: Secondary | ICD-10-CM

## 2023-09-28 MED ORDER — RISPERIDONE 4 MG PO TABS: 8.0000 mg | ORAL_TABLET | Freq: Every day | ORAL | 1 refills | Status: AC

## 2023-09-28 NOTE — Progress Notes (Signed)
 [facility] [address] [place], [place] [address]  Internal MEDICINE  Office Visit Note  Patient Name: Clinton Hart  [id number]  [id number]  Date of Service: 09/28/2023   Complaints/HPI Pt is here for establishment of PCP. Chief Complaint  Patient presents with   New Patient (Initial Visit)    HPI Clinton Hart presents for a new patient visit to establish care.  Well-appearing 43 y.o. male with schizophrenia and is only on risperidone .  Work: disabled  Home: lives at home alone  Diet: diet  is fair  Exercise: not currently.  Tobacco use: smoke cigarettes about 5 cigs per day. Has been smoking for 3 years  Alcohol use: none  Illicit drug use: none  Labs: due for routine labs  New or worsening pain: none    Current Medication: Outpatient Encounter Medications as of 09/28/2023  Medication Sig   [DISCONTINUED] albuterol  (VENTOLIN  HFA) 108 (90 Base) MCG/ACT inhaler Inhale 2 puffs into the lungs every 6 (six) hours as needed for wheezing or shortness of breath.   [DISCONTINUED] benztropine  (COGENTIN ) 0.5 MG tablet Take 0.5 tablets (0.25 mg total) by mouth daily.   [DISCONTINUED] D3 SUPER STRENGTH 50 MCG (2000 UT) CAPS Take 1 capsule (2,000 Units total) by mouth daily.   [DISCONTINUED] dicloxacillin  (DYNAPEN ) 250 MG capsule Take 1 capsule (250 mg total) by mouth 4 (four) times daily for 7 days.   [DISCONTINUED] ondansetron  (ZOFRAN -ODT) 4 MG disintegrating tablet Take 1 tablet (4 mg total) by mouth every 8 (eight) hours as needed for nausea or vomiting.   [DISCONTINUED] risperiDONE  (RISPERDAL ) 4 MG tablet Take 1 tablet (4 mg total) by mouth daily.   risperidone  (RISPERDAL ) 4 MG tablet Take 2 tablets (8 mg total) by mouth daily.   No facility-administered encounter medications on file as of 09/28/2023.    Surgical History: No past surgical history on file.  Medical History: Past Medical History:  Diagnosis Date   Asthma    Schizophrenia (HCC)     Family  History: No family history on file.  Social History   Socioeconomic History   Marital status: Single    Spouse name: Not on file   Number of children: Not on file   Years of education: Not on file   Highest education level: Not on file  Occupational History   Not on file  Tobacco Use   Smoking status: Every Day    Types: Cigarettes   Smokeless tobacco: Never  Vaping Use   Vaping status: Some Days   Substances: Nicotine  Substance and Sexual Activity   Alcohol use: Not Currently   Drug use: Not Currently   Sexual activity: Not Currently  Other Topics Concern   Not on file  Social History Narrative   Not on file   Social Drivers of Health   Financial Resource Strain: Not on file  Food Insecurity: Not on file  Transportation Needs: Not on file  Physical Activity: Not on file  Stress: Not on file  Social Connections: Not on file  Intimate Partner Violence: Not on file     Review of Systems  Constitutional:  Positive for fatigue. Negative for chills and fever.  HENT: Negative.    Respiratory:  Negative for cough, chest tightness, shortness of breath and wheezing.   Cardiovascular: Negative.  Negative for chest pain and palpitations.  Gastrointestinal: Negative.   Genitourinary: Negative.   Musculoskeletal: Negative.  Negative for arthralgias.  Skin: Negative.   Neurological:  Negative for facial asymmetry, weakness and  numbness.  Psychiatric/Behavioral:  Positive for behavioral problems. Negative for self-injury, sleep disturbance and suicidal ideas. The patient is nervous/anxious.     Vital Signs: BP 110/78   Pulse (!) 111   Temp 98.5 F (36.9 C)   Resp 16   Ht 5' 10 (1.778 m)   Wt 187 lb (84.8 kg)   SpO2 95%   BMI 26.83 kg/m    Physical Exam Vitals reviewed.  Constitutional:      General: He is not in acute distress.    Appearance: Normal appearance. He is not ill-appearing.  HENT:     Head: Normocephalic and atraumatic.  Eyes:     Pupils:  Pupils are equal, round, and reactive to light.  Cardiovascular:     Rate and Rhythm: Normal rate and regular rhythm.  Pulmonary:     Effort: Pulmonary effort is normal. No respiratory distress.  Neurological:     Mental Status: He is alert and oriented to person, place, and time.  Psychiatric:        Mood and Affect: Mood normal.        Behavior: Behavior normal.       Assessment/Plan: 1. Other fatigue (Primary) Routine labs ordered - CBC with Differential/Platelet - CMP14+EGFR - Lipid Profile - Vitamin D  (25 hydroxy) - B12 and Folate Panel - Hgb A1C w/o eAG - TSH + free T4  2. Elevated fasting glucose Routine labs ordered  - CBC with Differential/Platelet - CMP14+EGFR - Lipid Profile - Vitamin D  (25 hydroxy) - B12 and Folate Panel - Hgb A1C w/o eAG - TSH + free T4  3. Mixed hyperlipidemia Routine labs ordered  - CBC with Differential/Platelet - CMP14+EGFR - Lipid Profile - Vitamin D  (25 hydroxy) - B12 and Folate Panel - Hgb A1C w/o eAG - TSH + free T4  4. B12 deficiency Routine labs ordered  - CBC with Differential/Platelet - CMP14+EGFR - B12 and Folate Panel - TSH + free T4  5. Vitamin D  deficiency Routine labs ordered  - CBC with Differential/Platelet - CMP14+EGFR - Lipid Profile - Vitamin D  (25 hydroxy) - B12 and Folate Panel - Hgb A1C w/o eAG - TSH + free T4  6. Schizoaffective disorder, depressive type (HCC) Continue risperidone  as prescribed by his psychiatrist,  - risperidone  (RISPERDAL ) 4 MG tablet; Take 2 tablets (8 mg total) by mouth daily.  Dispense: 30 tablet; Refill: 1    General Counseling: Clinton Hart verbalizes understanding of the findings of todays visit and agrees with plan of treatment. I have discussed any further diagnostic evaluation that may be needed or ordered today. We also reviewed his medications today. he has been encouraged to call the office with any questions or concerns that should arise related to todays  visit.    Orders Placed This Encounter  Procedures   CBC with Differential/Platelet   CMP14+EGFR   Lipid Profile   Vitamin D  (25 hydroxy)   B12 and Folate Panel   Hgb A1C w/o eAG   TSH + free T4    Meds ordered this encounter  Medications   risperidone  (RISPERDAL ) 4 MG tablet    Sig: Take 2 tablets (8 mg total) by mouth daily.    Dispense:  30 tablet    Refill:  1    Return for CPE, Karman Veney PCP at earliest available opening and have labs done prior to next visit. .  Time spent:30 Minutes Time spent with patient included reviewing progress notes, labs, imaging studies, and discussing plan for follow up.  [place] Controlled Substance Database was reviewed by me for overdose risk score (ORS)   This patient was seen by Mardy Maxin, FNP-C in collaboration with Dr. Sigrid Bathe as a part of collaborative care agreement.   Theophile Harvie R. Maxin, MSN, FNP-C Internal Medicine

## 2023-09-29 ENCOUNTER — Encounter: Payer: Self-pay | Admitting: Nurse Practitioner

## 2023-10-24 ENCOUNTER — Emergency Department: Payer: 59

## 2023-10-24 ENCOUNTER — Other Ambulatory Visit: Payer: Self-pay

## 2023-10-24 ENCOUNTER — Emergency Department
Admission: EM | Admit: 2023-10-24 | Discharge: 2023-10-24 | Discharge status: Against Medical Advice | Payer: 59 | Attending: Emergency Medicine | Admitting: Emergency Medicine

## 2023-10-24 DIAGNOSIS — R079 Chest pain, unspecified: Secondary | ICD-10-CM | POA: Diagnosis present

## 2023-10-24 DIAGNOSIS — Z5321 Procedure and treatment not carried out due to patient leaving prior to being seen by health care provider: Secondary | ICD-10-CM | POA: Insufficient documentation

## 2023-10-24 LAB — CBC
HCT: 44 % (ref 39.0–52.0)
Hemoglobin: 15.5 g/dL (ref 13.0–17.0)
MCH: 30.9 pg (ref 26.0–34.0)
MCHC: 35.2 g/dL (ref 30.0–36.0)
MCV: 87.6 fL (ref 80.0–100.0)
Platelets: 234 10*3/uL (ref 150–400)
RBC: 5.02 MIL/uL (ref 4.22–5.81)
RDW: 12.7 % (ref 11.5–15.5)
WBC: 4.6 10*3/uL (ref 4.0–10.5)
nRBC: 0 % (ref 0.0–0.2)

## 2023-10-24 LAB — BASIC METABOLIC PANEL
Anion gap: 9 (ref 5–15)
BUN: 7 mg/dL (ref 6–20)
CO2: 28 mmol/L (ref 22–32)
Calcium: 9.3 mg/dL (ref 8.9–10.3)
Chloride: 102 mmol/L (ref 98–111)
Creatinine, Ser: 1.01 mg/dL (ref 0.61–1.24)
GFR, Estimated: >60 mL/min (ref >60)
Glucose, Bld: 95 mg/dL (ref 70–99)
Potassium: 3.6 mmol/L (ref 3.5–5.1)
Sodium: 139 mmol/L (ref 135–145)

## 2023-10-24 LAB — TROPONIN I (HIGH SENSITIVITY): Troponin I (High Sensitivity): <2 ng/L (ref <18)

## 2023-10-24 NOTE — ED Triage Notes (Signed)
Pt presents to ED with c/o of central CP with no radiation, pt states it woke him up from sleep. NAD noted. Pt denies cardiac HX. Pt states pain is gone now. Pt states pain lasted 15-20 minutes.

## 2023-10-24 NOTE — ED Triage Notes (Signed)
First RN Note: Pt to ED via EMS with c/o CP. Per EMS pain started approx 1 hr ago, pain is sporadic and intermittent. Per EMS pt with hx of schizophrenia.    20G to L AC 324 ASA  NSR  119/77

## 2023-10-24 NOTE — ED Provider Triage Note (Signed)
Emergency Medicine Provider Triage Evaluation Note  Clinton Hart, a 43 y.o. male  was evaluated in triage.  Pt complains of central chest pain.  Patient presents to the ED for chest pain.  Patient was at rest at the time of onset.  He denies any current symptoms at this time..  Review of Systems  Positive: CP  Negative: NV   Physical Exam  BP 115/78 (BP Location: Right Arm)   Pulse 100   Temp 98.1 F (36.7 C) (Oral)   Resp 18   Ht 5\' 10"  (1.778 m)   Wt 86.2 kg   SpO2 96%   BMI 27.26 kg/m  Gen:   Awake, no distress  NAD Resp:  Normal effort CTA MSK:   Moves extremities without difficulty  Other:    Medical Decision Making  Medically screening exam initiated at 7:15 AM.  Appropriate orders placed.  Clinton Hart was informed that the remainder of the evaluation will be completed by another provider, this initial triage assessment does not replace that evaluation, and the importance of remaining in the ED until their evaluation is complete.  Patient to the ED for evaluation of chest pain onset this morning.  Patient denies any current symptoms.  Presents to the ED via EMS from home.   Clinton Hart, [place] 10/24/23 [phone]

## 2023-11-01 ENCOUNTER — Emergency Department
Admission: EM | Admit: 2023-11-01 | Discharge: 2023-11-01 | Discharge status: Against Medical Advice | Payer: 59 | Attending: Emergency Medicine | Admitting: Emergency Medicine

## 2023-11-01 DIAGNOSIS — Z5321 Procedure and treatment not carried out due to patient leaving prior to being seen by health care provider: Secondary | ICD-10-CM | POA: Diagnosis not present

## 2023-11-01 DIAGNOSIS — R079 Chest pain, unspecified: Secondary | ICD-10-CM | POA: Insufficient documentation

## 2023-11-01 NOTE — ED Notes (Signed)
 BIB EMS from home. [facility] CP x1hr,  117/69 92HR 100%RA

## 2023-11-16 ENCOUNTER — Ambulatory Visit (INDEPENDENT_AMBULATORY_CARE_PROVIDER_SITE_OTHER): Payer: 59 | Admitting: Nurse Practitioner

## 2023-11-16 ENCOUNTER — Encounter: Payer: Self-pay | Admitting: Nurse Practitioner

## 2023-11-16 VITALS — BP 114/80 | HR 89 | Temp 98.7°F | Resp 16 | Ht 70.0 in | Wt 186.0 lb

## 2023-11-16 DIAGNOSIS — Z0000 Encounter for general adult medical examination without abnormal findings: Secondary | ICD-10-CM

## 2023-11-16 DIAGNOSIS — E1169 Type 2 diabetes mellitus with other specified complication: Secondary | ICD-10-CM | POA: Diagnosis not present

## 2023-11-16 DIAGNOSIS — F251 Schizoaffective disorder, depressive type: Secondary | ICD-10-CM | POA: Diagnosis not present

## 2023-11-16 DIAGNOSIS — E119 Type 2 diabetes mellitus without complications: Secondary | ICD-10-CM

## 2023-11-16 DIAGNOSIS — E785 Hyperlipidemia, unspecified: Secondary | ICD-10-CM

## 2023-11-16 NOTE — Progress Notes (Signed)
 [facility] [address] [place], [place] [address]  Internal MEDICINE  Office Visit Note  Patient Name: Clinton Hart  [id number]  [id number]  Date of Service: 11/16/2023  Chief Complaint  Patient presents with   Medicare Wellness    HPI Clinton Hart presents for a medicare annual wellness visit.  Well-appearing 43 y.o. male with diabetes, high cholesterol, and schizoaffective disorder.  Labs: reminded patient to have labs drawn  New or worsening pain: none  Other concerns: has some high anxiety at times and gets chest pain, went to ED on 10/24/2023 abd 11/01/2023 but did not stay to be seen since his anxiety had calmed down and the chest pain resolved.       11/16/2023   10:59 AM  MMSE - Mini Mental State Exam  Orientation to time 5  Orientation to Place 5  Registration 3  Attention/ Calculation 5  Recall 3  Language- name 2 objects 2  Language- repeat 1  Language- follow 3 step command 0  Language- read & follow direction 1  Write a sentence 1  Copy design 1  Total score 27    Functional Status Survey: Is the patient deaf or have difficulty hearing?: No Does the patient have difficulty seeing, even when wearing glasses/contacts?: No Does the patient have difficulty concentrating, remembering, or making decisions?: No Does the patient have difficulty walking or climbing stairs?: No Does the patient have difficulty dressing or bathing?: No Does the patient have difficulty doing errands alone such as visiting a doctor's office or shopping?: Yes     06/24/2022    1:29 PM 08/10/2022    9:03 PM 09/17/2022    1:16 PM 09/28/2023    2:34 PM 11/16/2023   10:58 AM  Fall Risk  Falls in the past year? 0   0 0  Was there an injury with Fall? 0   0 0  Fall Risk Category Calculator 0   0 0  Fall Risk Category (Retired) Low      (RETIRED) Patient Fall Risk Level Low fall risk Low fall risk Low fall risk    Patient at Risk for Falls Due to    No Fall Risks No Fall  Risks  Fall risk Follow up Falls evaluation completed   Falls evaluation completed Falls evaluation completed       11/16/2023   10:58 AM  Depression screen PHQ 2/9  Decreased Interest 0  Down, Depressed, Hopeless 0  PHQ - 2 Score 0       11/16/2023   11:27 AM 06/24/2022    1:29 PM  GAD 7 : Generalized Anxiety Score  Nervous, Anxious, on Edge 1 0  Control/stop worrying 0 0  Worry too much - different things 0 0  Trouble relaxing 0 0  Restless 0 0  Easily annoyed or irritable 1 0  Afraid - awful might happen 0 0  Total GAD 7 Score 2 0  Anxiety Difficulty Not difficult at all Not difficult at all      Current Medication: Outpatient Encounter Medications as of 11/16/2023  Medication Sig   risperidone (RISPERDAL) 4 MG tablet Take 2 tablets (8 mg total) by mouth daily.   No facility-administered encounter medications on file as of 11/16/2023.    Surgical History: History reviewed. No pertinent surgical history.  Medical History: Past Medical History:  Diagnosis Date   Asthma    Schizophrenia (HCC)     Family History: History reviewed. No pertinent family history.  Social  History   Socioeconomic History   Marital status: Single    Spouse name: Not on file   Number of children: Not on file   Years of education: Not on file   Highest education level: Not on file  Occupational History   Not on file  Tobacco Use   Smoking status: Every Day    Types: Cigarettes   Smokeless tobacco: Never  Vaping Use   Vaping status: Some Days   Substances: Nicotine  Substance and Sexual Activity   Alcohol use: Not Currently   Drug use: Not Currently   Sexual activity: Not Currently  Other Topics Concern   Not on file  Social History Narrative   Not on file   Social Drivers of Health   Financial Resource Strain: Not on file  Food Insecurity: Not on file  Transportation Needs: Not on file  Physical Activity: Not on file  Stress: Not on file  Social Connections: Not  on file  Intimate Partner Violence: Not on file      Review of Systems  Constitutional:  Negative for activity change, appetite change, chills, fatigue, fever and unexpected weight change.  HENT: Negative.  Negative for congestion, ear pain, rhinorrhea, sore throat and trouble swallowing.   Eyes: Negative.   Respiratory: Negative.  Negative for cough, chest tightness, shortness of breath and wheezing.   Cardiovascular: Negative.  Negative for chest pain and palpitations.  Gastrointestinal: Negative.  Negative for abdominal pain, blood in stool, constipation, diarrhea, nausea and vomiting.  Endocrine: Negative.   Genitourinary: Negative.  Negative for difficulty urinating, dysuria, frequency, hematuria and urgency.  Musculoskeletal: Negative.  Negative for arthralgias, back pain, joint swelling, myalgias and neck pain.  Skin: Negative.  Negative for rash and wound.  Allergic/Immunologic: Negative.  Negative for immunocompromised state.  Neurological: Negative.  Negative for dizziness, seizures, numbness and headaches.  Hematological: Negative.   Psychiatric/Behavioral: Negative.  Negative for behavioral problems, self-injury and suicidal ideas. The patient is not nervous/anxious.     Vital Signs: BP 114/80   Pulse 89   Temp 98.7 F (37.1 C)   Resp 16   Ht 5\' 10"  (1.778 m)   Wt 186 lb (84.4 kg)   SpO2 98%   BMI 26.69 kg/m    Physical Exam Vitals reviewed.  Constitutional:      General: He is not in acute distress.    Appearance: Normal appearance. He is not ill-appearing or diaphoretic.  HENT:     Head: Normocephalic and atraumatic.     Right Ear: Tympanic membrane, ear canal and external ear normal.     Left Ear: Tympanic membrane, ear canal and external ear normal.     Nose: Nose normal.     Mouth/Throat:     Mouth: Mucous membranes are moist.     Pharynx: Oropharynx is clear.  Eyes:     Pupils: Pupils are equal, round, and reactive to light.  Cardiovascular:      Rate and Rhythm: Normal rate and regular rhythm.     Pulses: Normal pulses.     Heart sounds: Normal heart sounds. No murmur heard. Pulmonary:     Effort: Pulmonary effort is normal. No respiratory distress.     Breath sounds: Normal breath sounds. No wheezing.  Abdominal:     General: Bowel sounds are normal. There is no distension.     Palpations: Abdomen is soft. There is no mass.     Tenderness: There is no abdominal tenderness. There is no guarding  or rebound.     Hernia: No hernia is present.  Musculoskeletal:        General: Normal range of motion.     Cervical back: Normal range of motion and neck supple.  Skin:    General: Skin is warm and dry.     Capillary Refill: Capillary refill takes less than 2 seconds.  Neurological:     Mental Status: He is alert and oriented to person, place, and time.  Psychiatric:        Mood and Affect: Mood normal.        Behavior: Behavior normal.        Assessment/Plan: 1. Encounter for subsequent annual wellness visit (AWV) in Medicare patient (Primary) Age-appropriate preventive screenings and vaccinations discussed. Routine labs for health maintenance results were previously ordered and patient was reminded to have his labs drawn. PHM updated.    2. Type 2 diabetes mellitus without complication, without long-term current use of insulin (HCC) Waiting for updated labs to determine severity, had a previous A1c of 7.2 at another facility. Not currently on any medications for this problem.   3. Hyperlipidemia associated with type 2 diabetes mellitus (HCC) Waiting for updated labs to determine current status   4. Schizoaffective disorder, depressive type (HCC) Taking risperidone 8 mg daily via his psychiatrist      General Counseling: Clinton Hart verbalizes understanding of the findings of todays visit and agrees with plan of treatment. I have discussed any further diagnostic evaluation that may be needed or ordered today. We also  reviewed his medications today. he has been encouraged to call the office with any questions or concerns that should arise related to todays visit.    Orders Placed This Encounter  Procedures   Urine Microalbumin w/creat. ratio    No orders of the defined types were placed in this encounter.   Return in about 4 weeks (around 12/14/2023) for F/U, Labs, Clinton Hart PCP, have labs done prior to office visit .   Total time spent:30 Minutes Time spent includes review of chart, medications, test results, and follow up plan with the patient.   [place] Controlled Substance Database was reviewed by me.  This patient was seen by Sallyanne Kuster, FNP-C in collaboration with Dr. Beverely Risen as a part of collaborative care agreement.  Atreyu Mak R. Tedd Sias, MSN, FNP-C Internal medicine

## 2023-11-19 ENCOUNTER — Emergency Department
Admission: EM | Admit: 2023-11-19 | Discharge: 2023-11-19 | Disposition: A | Discharge status: Home / Self Care | Payer: 59 | Attending: Emergency Medicine | Admitting: Emergency Medicine

## 2023-11-19 ENCOUNTER — Other Ambulatory Visit: Payer: Self-pay

## 2023-11-19 ENCOUNTER — Emergency Department: Payer: 59

## 2023-11-19 DIAGNOSIS — R6883 Chills (without fever): Secondary | ICD-10-CM | POA: Insufficient documentation

## 2023-11-19 DIAGNOSIS — R079 Chest pain, unspecified: Secondary | ICD-10-CM | POA: Diagnosis present

## 2023-11-19 DIAGNOSIS — F17200 Nicotine dependence, unspecified, uncomplicated: Secondary | ICD-10-CM | POA: Insufficient documentation

## 2023-11-19 LAB — BASIC METABOLIC PANEL
Anion gap: 11 (ref 5–15)
BUN: 13 mg/dL (ref 6–20)
CO2: 24 mmol/L (ref 22–32)
Calcium: 9 mg/dL (ref 8.9–10.3)
Chloride: 100 mmol/L (ref 98–111)
Creatinine, Ser: 1.07 mg/dL (ref 0.61–1.24)
GFR, Estimated: >60 mL/min (ref >60)
Glucose, Bld: 110 mg/dL — ABNORMAL HIGH (ref 70–99)
Potassium: 3.8 mmol/L (ref 3.5–5.1)
Sodium: 135 mmol/L (ref 135–145)

## 2023-11-19 LAB — CBC
HCT: 40.9 % (ref 39.0–52.0)
Hemoglobin: 14.5 g/dL (ref 13.0–17.0)
MCH: 30.7 pg (ref 26.0–34.0)
MCHC: 35.5 g/dL (ref 30.0–36.0)
MCV: 86.5 fL (ref 80.0–100.0)
Platelets: 239 10*3/uL (ref 150–400)
RBC: 4.73 MIL/uL (ref 4.22–5.81)
RDW: 12.5 % (ref 11.5–15.5)
WBC: 7 10*3/uL (ref 4.0–10.5)
nRBC: 0 % (ref 0.0–0.2)

## 2023-11-19 LAB — RESP PANEL BY RT-PCR (RSV, FLU A&B, COVID)  RVPGX2
Influenza A by PCR: NEGATIVE
Influenza B by PCR: NEGATIVE
Resp Syncytial Virus by PCR: NEGATIVE
SARS Coronavirus 2 by RT PCR: NEGATIVE

## 2023-11-19 LAB — TROPONIN I (HIGH SENSITIVITY)
Troponin I (High Sensitivity): 3 ng/L (ref <18)
Troponin I (High Sensitivity): 3 ng/L (ref <18)

## 2023-11-19 NOTE — ED Triage Notes (Signed)
 Arrives from home via [facility]>  C/O not feeling well and chest pain, 324 ASA given, and 1 SL NTG.   110 HR initiall, 500 LR given, HR 84 VS wnl.

## 2023-11-19 NOTE — Discharge Instructions (Signed)
 You were seen in the emergency department for chest pain.  Your EKG and your heart enzyme was normal.  It is importantly follow-up closely with your primary care physician as a mail to refer you to cardiology for further stress testing.  Return to the emergency department if you have new return of symptoms.  Thank you for choosing [place] for your health care, it was my pleasure to care for you today!  Corena Herter, MD

## 2023-11-19 NOTE — ED Provider Notes (Signed)
 [facility] Provider Note    Event Date/Time   First MD Initiated Contact with Patient 11/19/23 1504     (approximate)   History   No chief complaint on file.   HPI  Clinton Hart is a 43 y.o. male presents to the emergency department following an episode of chest pain.  Patient states that he had an episode of chest pain and chills that started earlier today.  States that states that his chest pain lasted for short period of time and since resolved.  Denies any active chest pain at this time.  Denies any shortness of breath or nausea or vomiting.  Denies any swelling in his legs.  Denies any history of DVT or PE.  Denies recent trip or travel.  No prior stress testing.  Does endorse tobacco use.  No family history of heart attack at a young age.     Physical Exam   Triage Vital Signs: ED Triage Vitals  Encounter Vitals Group     BP 11/19/23 1311 117/81     Systolic BP Percentile --      Diastolic BP Percentile --      Pulse Rate 11/19/23 1311 (!) 103     Resp 11/19/23 1311 16     Temp 11/19/23 1311 98.4 F (36.9 C)     Temp Source 11/19/23 1311 Oral     SpO2 11/19/23 1311 96 %     Weight 11/19/23 1309 186 lb 1.1 oz (84.4 kg)     Height --      Head Circumference --      Peak Flow --      Pain Score 11/19/23 1309 0     Pain Loc --      Pain Education --      Exclude from Growth Chart --     Most recent vital signs: Vitals:   11/19/23 1530 11/19/23 1700  BP: 105/70 114/71  Pulse: 67 66  Resp: 17   Temp:    SpO2: 97% 98%    Physical Exam Constitutional:      Appearance: He is well-developed.  HENT:     Head: Atraumatic.  Eyes:     Conjunctiva/sclera: Conjunctivae normal.  Cardiovascular:     Rate and Rhythm: Regular rhythm.  Pulmonary:     Effort: No respiratory distress.  Abdominal:     Tenderness: There is no abdominal tenderness.  Musculoskeletal:        General: Normal range of motion.     Cervical back: Normal range  of motion.     Right lower leg: No edema.     Left lower leg: No edema.  Skin:    General: Skin is warm.     Capillary Refill: Capillary refill takes less than 2 seconds.  Neurological:     Mental Status: He is alert. Mental status is at baseline.     IMPRESSION / MDM / ASSESSMENT AND PLAN / ED COURSE  I reviewed the triage vital signs and the nursing notes.  Differential diagnosis including musculoskeletal, pneumonia, ACS, anemia, viral illness including COVID/influenza  No tearing chest pain, have a low suspicion for dissection.  No shortness of breath, no pleuritic pain, low risk Wells criteria and have a low suspicion for pulmonary embolism.  EKG  I, Corena Herter, the attending physician, personally viewed and interpreted this ECG.   Rate: 91  Rhythm: Normal sinus  Axis: Normal  Intervals: Normal  ST&T Change: None No change  when compared to prior EKG    RADIOLOGY I independently reviewed imaging, my interpretation of imaging: No signs of pneumonia.  No vitamin D standing.  Read as no acute findings  LABS (all labs ordered are listed, but only abnormal results are displayed) Labs interpreted as -    Labs Reviewed  BASIC METABOLIC PANEL - Abnormal; Notable for the following components:      Result Value   Glucose, Bld 110 (*)    All other components within normal limits  RESP PANEL BY RT-PCR (RSV, FLU A&B, COVID)  RVPGX2  CBC  TROPONIN I (HIGH SENSITIVITY)  TROPONIN I (HIGH SENSITIVITY)     MDM  Patient currently chest pain-free.  Low risk heart score and serial troponins are negative.  Have low suspicion for ACS.  Creatinine at baseline with no significant electrolyte abnormality.  No significant anemia.  COVID and influenza testing are negative.  Chest x-ray with no signs of pneumonia.  Have a very low suspicion for pulmonary embolism.  Have low suspicion for dissection.  No signs of myocarditis.  Clinical picture is not consistent with pericarditis.   Discussed close follow-up with an outpatient with primary care provider.  Given return precautions for any return of symptoms.  Discussed smoking cessation.     PROCEDURES:  Critical Care performed: No  Procedures  Patient's presentation is most consistent with acute complicated illness / injury requiring diagnostic workup.   MEDICATIONS ORDERED IN ED: Medications - No data to display  FINAL CLINICAL IMPRESSION(S) / ED DIAGNOSES   Final diagnoses:  Chest pain, unspecified type     Rx / DC Orders   ED Discharge Orders     None        Note:  This document was prepared using Dragon voice recognition software and may include unintentional dictation errors.   Corena Herter, MD 11/19/23 2328

## 2023-11-19 NOTE — ED Triage Notes (Signed)
 C/O general slickness today x 3 hours. C?O intermittent CP and chills.  AAOx3.  Skin warm and dry. NAD

## 2023-11-27 ENCOUNTER — Encounter: Payer: Self-pay | Admitting: Nurse Practitioner

## 2023-12-14 ENCOUNTER — Ambulatory Visit: Payer: 59 | Admitting: Nurse Practitioner

## 2023-12-14 NOTE — Progress Notes (Signed)
Viral illness concerning for covid/influenza

## 2023-12-15 LAB — HGB A1C W/O EAG: Hgb A1c MFr Bld: 5.4 % (ref 4.8–5.6)

## 2023-12-15 LAB — CMP14+EGFR
ALT: 32 IU/L (ref 0–44)
AST: 24 IU/L (ref 0–40)
Albumin: 4.4 g/dL (ref 4.1–5.1)
Alkaline Phosphatase: 69 IU/L (ref 44–121)
BUN/Creatinine Ratio: 11 (ref 9–20)
BUN: 10 mg/dL (ref 6–24)
Bilirubin Total: 0.3 mg/dL (ref 0.0–1.2)
CO2: 25 mmol/L (ref 20–29)
Calcium: 9.5 mg/dL (ref 8.7–10.2)
Chloride: 102 mmol/L (ref 96–106)
Creatinine, Ser: 0.93 mg/dL (ref 0.76–1.27)
Globulin, Total: 2.9 g/dL (ref 1.5–4.5)
Glucose: 80 mg/dL (ref 70–99)
Potassium: 4.4 mmol/L (ref 3.5–5.2)
Sodium: 140 mmol/L (ref 134–144)
Total Protein: 7.3 g/dL (ref 6.0–8.5)
eGFR: 106 mL/min/{1.73_m2} (ref >59)

## 2023-12-15 LAB — B12 AND FOLATE PANEL
Folate: 17.4 ng/mL (ref >3.0)
Vitamin B-12: 907 pg/mL (ref 232–1245)

## 2023-12-15 LAB — CBC WITH DIFFERENTIAL/PLATELET
Basophils Absolute: 0 10*3/uL (ref 0.0–0.2)
Basos: 1 %
EOS (ABSOLUTE): 0.4 10*3/uL (ref 0.0–0.4)
Eos: 7 %
Hematocrit: 48.3 % (ref 37.5–51.0)
Hemoglobin: 16.5 g/dL (ref 13.0–17.7)
Immature Grans (Abs): 0 10*3/uL (ref 0.0–0.1)
Immature Granulocytes: 0 %
Lymphocytes Absolute: 2 10*3/uL (ref 0.7–3.1)
Lymphs: 38 %
MCH: 31.3 pg (ref 26.6–33.0)
MCHC: 34.2 g/dL (ref 31.5–35.7)
MCV: 92 fL (ref 79–97)
Monocytes Absolute: 0.6 10*3/uL (ref 0.1–0.9)
Monocytes: 12 %
Neutrophils Absolute: 2.4 10*3/uL (ref 1.4–7.0)
Neutrophils: 42 %
Platelets: 262 10*3/uL (ref 150–450)
RBC: 5.27 x10E6/uL (ref 4.14–5.80)
RDW: 13.7 % (ref 11.6–15.4)
WBC: 5.4 10*3/uL (ref 3.4–10.8)

## 2023-12-15 LAB — LIPID PANEL
Chol/HDL Ratio: 4 ratio (ref 0.0–5.0)
Cholesterol, Total: 188 mg/dL (ref 100–199)
HDL: 47 mg/dL (ref >39)
LDL Chol Calc (NIH): 129 mg/dL — ABNORMAL HIGH (ref 0–99)
Triglycerides: 65 mg/dL (ref 0–149)
VLDL Cholesterol Cal: 12 mg/dL (ref 5–40)

## 2023-12-15 LAB — TSH+FREE T4
Free T4: 1.11 ng/dL (ref 0.82–1.77)
TSH: 1.26 u[IU]/mL (ref 0.450–4.500)

## 2023-12-15 LAB — VITAMIN D 25 HYDROXY (VIT D DEFICIENCY, FRACTURES): Vit D, 25-Hydroxy: 38.9 ng/mL (ref 30.0–100.0)

## 2023-12-25 ENCOUNTER — Other Ambulatory Visit: Payer: Self-pay

## 2023-12-25 ENCOUNTER — Emergency Department
Admission: EM | Admit: 2023-12-25 | Discharge: 2023-12-25 | Disposition: A | Discharge status: Against Medical Advice | Attending: Emergency Medicine | Admitting: Emergency Medicine

## 2023-12-25 DIAGNOSIS — H00014 Hordeolum externum left upper eyelid: Secondary | ICD-10-CM | POA: Insufficient documentation

## 2023-12-25 DIAGNOSIS — Z5321 Procedure and treatment not carried out due to patient leaving prior to being seen by health care provider: Secondary | ICD-10-CM | POA: Diagnosis not present

## 2023-12-25 NOTE — ED Notes (Signed)
 Pt called to be seen, pt not visualized in the lobby at this time.

## 2023-12-25 NOTE — ED Triage Notes (Signed)
 Pt c/o left eye stye that started yesterday and got worse today. Left upper eyelid is swollen, pt reports fluid discharge.

## 2023-12-30 ENCOUNTER — Ambulatory Visit: Admitting: Nurse Practitioner

## 2024-01-16 ENCOUNTER — Emergency Department
Admission: EM | Admit: 2024-01-16 | Discharge: 2024-01-16 | Disposition: A | Discharge status: Home / Self Care | Attending: Emergency Medicine | Admitting: Emergency Medicine

## 2024-01-16 ENCOUNTER — Emergency Department

## 2024-01-16 ENCOUNTER — Other Ambulatory Visit: Payer: Self-pay

## 2024-01-16 DIAGNOSIS — R0789 Other chest pain: Secondary | ICD-10-CM | POA: Diagnosis present

## 2024-01-16 DIAGNOSIS — E119 Type 2 diabetes mellitus without complications: Secondary | ICD-10-CM | POA: Insufficient documentation

## 2024-01-16 DIAGNOSIS — F17200 Nicotine dependence, unspecified, uncomplicated: Secondary | ICD-10-CM | POA: Insufficient documentation

## 2024-01-16 DIAGNOSIS — J45909 Unspecified asthma, uncomplicated: Secondary | ICD-10-CM | POA: Diagnosis not present

## 2024-01-16 LAB — CBC WITH DIFFERENTIAL/PLATELET
Abs Immature Granulocytes: 0.01 10*3/uL (ref 0.00–0.07)
Basophils Absolute: 0 10*3/uL (ref 0.0–0.1)
Basophils Relative: 0 %
Eosinophils Absolute: 0.1 10*3/uL (ref 0.0–0.5)
Eosinophils Relative: 2 %
HCT: 41.2 % (ref 39.0–52.0)
Hemoglobin: 15 g/dL (ref 13.0–17.0)
Immature Granulocytes: 0 %
Lymphocytes Relative: 28 %
Lymphs Abs: 1.4 10*3/uL (ref 0.7–4.0)
MCH: 31.6 pg (ref 26.0–34.0)
MCHC: 36.4 g/dL — ABNORMAL HIGH (ref 30.0–36.0)
MCV: 86.9 fL (ref 80.0–100.0)
Monocytes Absolute: 0.6 10*3/uL (ref 0.1–1.0)
Monocytes Relative: 11 %
Neutro Abs: 3 10*3/uL (ref 1.7–7.7)
Neutrophils Relative %: 59 %
Platelets: 243 10*3/uL (ref 150–400)
RBC: 4.74 MIL/uL (ref 4.22–5.81)
RDW: 13 % (ref 11.5–15.5)
WBC: 5.1 10*3/uL (ref 4.0–10.5)
nRBC: 0 % (ref 0.0–0.2)

## 2024-01-16 LAB — COMPREHENSIVE METABOLIC PANEL WITH GFR
ALT: 27 U/L (ref 0–44)
AST: 21 U/L (ref 15–41)
Albumin: 3.6 g/dL (ref 3.5–5.0)
Alkaline Phosphatase: 47 U/L (ref 38–126)
Anion gap: 6 (ref 5–15)
BUN: 10 mg/dL (ref 6–20)
CO2: 25 mmol/L (ref 22–32)
Calcium: 9.3 mg/dL (ref 8.9–10.3)
Chloride: 104 mmol/L (ref 98–111)
Creatinine, Ser: 1.03 mg/dL (ref 0.61–1.24)
GFR, Estimated: >60 mL/min (ref >60)
Glucose, Bld: 97 mg/dL (ref 70–99)
Potassium: 3.6 mmol/L (ref 3.5–5.1)
Sodium: 135 mmol/L (ref 135–145)
Total Bilirubin: 0.5 mg/dL (ref 0.0–1.2)
Total Protein: 7.1 g/dL (ref 6.5–8.1)

## 2024-01-16 LAB — TROPONIN I (HIGH SENSITIVITY)
Troponin I (High Sensitivity): <2 ng/L (ref <18)
Troponin I (High Sensitivity): 2 ng/L (ref <18)

## 2024-01-16 NOTE — ED Triage Notes (Signed)
 Chest pain at 5am, now resolved.  Pt sts that it comes and goes.

## 2024-01-16 NOTE — ED Provider Notes (Signed)
 [facility] Provider Note    Event Date/Time   First MD Initiated Contact with Patient 01/16/24 [phone]     (approximate)   History   Chest Pain (Chest pain at 5am, now resolved.  Pt sts that it comes and goes.)   HPI  Clinton Hart is a 43 y.o. male with history of smoking, asthma, schizophrenia, diabetes, presenting with midsternal chest pain.  States started 5, last for an hour, relieved with Tylenol .  He denies any other symptoms, no leg swelling, shortness of breath, cough, URI symptoms, nausea, vomiting, diarrhea, abdominal pain, back pain.  States no prior cardiac history.  Does follow with a primary care doctor.    On independent chart review, he was seen in 2022 by family medicine for annual follow-up, he has also history of neutropenia and vitamin D  deficiency.  Physical Exam   Triage Vital Signs: ED Triage Vitals  Encounter Vitals Group     BP 01/16/24 0700 107/72     Systolic BP Percentile --      Diastolic BP Percentile --      Pulse Rate 01/16/24 0700 75     Resp 01/16/24 0700 18     Temp 01/16/24 0700 97.6 F (36.4 C)     Temp Source 01/16/24 0700 Oral     SpO2 01/16/24 0730 96 %     Weight 01/16/24 0702 190 lb (86.2 kg)     Height 01/16/24 0702 5\' 10"  (1.778 m)     Head Circumference --      Peak Flow --      Pain Score 01/16/24 0702 0     Pain Loc --      Pain Education --      Exclude from Growth Chart --     Most recent vital signs: Vitals:   01/16/24 1000 01/16/24 1030  BP: 94/66 103/76  Pulse: 67 65  Resp:  17  Temp:  97.6 F (36.4 C)  SpO2: 96% 96%     General: Awake, no distress.  CV:  Good peripheral perfusion.  Resp:  Normal effort.  Clear Abd:  No distention.  Soft nontender Other:  No unilateral Tenderness, No Lower Extremity Edema   ED Results / Procedures / Treatments   Labs (all labs ordered are listed, but only abnormal results are displayed) Labs Reviewed  CBC WITH DIFFERENTIAL/PLATELET -  Abnormal; Notable for the following components:      Result Value   MCHC 36.4 (*)    All other components within normal limits  COMPREHENSIVE METABOLIC PANEL WITH GFR  TROPONIN I (HIGH SENSITIVITY)  TROPONIN I (HIGH SENSITIVITY)     EKG  Sinus rhythm, rate of 73, normal QRS, normal QTc, no ischemic ST elevation, T wave flattening in aVL, not significant change compared to prior   RADIOLOGY Chest x-ray on my independent interpretation without obvious consolidation   PROCEDURES:  Critical Care performed: No  Procedures   MEDICATIONS ORDERED IN ED: Medications - No data to display   IMPRESSION / MDM / ASSESSMENT AND PLAN / ED COURSE  I reviewed the triage vital signs and the nursing notes.                              Differential diagnosis includes, but is not limited to, ACS, angina, musculoskeletal pain, COVID flu chondritis, he has no shortness of breath, leg swelling, hypoxia, tachycardia, to suggest PE, no  infectious symptoms suggest pneumonia or viral illness, pain is not tearing in nature, has resolved, considered but doubt dissection.  Will get labs, EKG, troponin, chest x-ray.  Shared decision making with patient and he is agreeable with the plan.  Patient's presentation is most consistent with acute presentation with potential threat to life or bodily function.  Independent review of labs and imaging below.  Clinical course is below, show decision making of patient and he is agreeable plan for discharge.  Considered but no indication for inpatient admission at this time, she is safe for outpatient management.  Will discharge with strict precautions.  Patient states that his primary care they can follow-up with, instructed him to give primary care doctor call on Monday to set up close follow-up.  He is agreeable with the plan.  Discharged with strict return precautions.  Clinical Course as of 01/16/24 1117  Sun Jan 16, 2024  0824 DG Chest 2 View No active  cardiopulmonary disease.  [TT]  [phone] Independent review of labs, no leukocytosis, electrolyte severely deranged, initial troponin is negative. [TT]  1044 Troponin I (High Sensitivity) Troponin x 2 is negative. [TT]  1113 On reassessment patient has no pain at this time.  Discussed imaging and lab results with him and he is agreeable plan for discharge. [TT]    Clinical Course User Index [TT] Drenda Gentle, Richard Champion, MD     FINAL CLINICAL IMPRESSION(S) / ED DIAGNOSES   Final diagnoses:  Atypical chest pain     Rx / DC Orders   ED Discharge Orders     None        Note:  This document was prepared using Dragon voice recognition software and may include unintentional dictation errors.    Shane Darling, MD 01/16/24 [phone]

## 2024-02-17 ENCOUNTER — Ambulatory Visit: Admitting: Nurse Practitioner

## 2024-03-24 ENCOUNTER — Emergency Department

## 2024-03-24 ENCOUNTER — Other Ambulatory Visit: Payer: Self-pay

## 2024-03-24 ENCOUNTER — Emergency Department
Admission: EM | Admit: 2024-03-24 | Discharge: 2024-03-24 | Discharge status: Against Medical Advice | Attending: Emergency Medicine | Admitting: Emergency Medicine

## 2024-03-24 DIAGNOSIS — Z5321 Procedure and treatment not carried out due to patient leaving prior to being seen by health care provider: Secondary | ICD-10-CM | POA: Diagnosis not present

## 2024-03-24 DIAGNOSIS — R079 Chest pain, unspecified: Secondary | ICD-10-CM | POA: Insufficient documentation

## 2024-03-24 DIAGNOSIS — F419 Anxiety disorder, unspecified: Secondary | ICD-10-CM | POA: Insufficient documentation

## 2024-03-24 HISTORY — DX: Anxiety disorder, unspecified: F41.9

## 2024-03-24 LAB — CBC
HCT: 45.9 % (ref 39.0–52.0)
Hemoglobin: 16.4 g/dL (ref 13.0–17.0)
MCH: 31.1 pg (ref 26.0–34.0)
MCHC: 35.7 g/dL (ref 30.0–36.0)
MCV: 86.9 fL (ref 80.0–100.0)
Platelets: 246 K/uL (ref 150–400)
RBC: 5.28 MIL/uL (ref 4.22–5.81)
RDW: 12.7 % (ref 11.5–15.5)
WBC: 5.7 K/uL (ref 4.0–10.5)
nRBC: 0 % (ref 0.0–0.2)

## 2024-03-24 LAB — BASIC METABOLIC PANEL WITH GFR
Anion gap: 9 (ref 5–15)
BUN: 6 mg/dL (ref 6–20)
CO2: 23 mmol/L (ref 22–32)
Calcium: 9.2 mg/dL (ref 8.9–10.3)
Chloride: 103 mmol/L (ref 98–111)
Creatinine, Ser: 0.85 mg/dL (ref 0.61–1.24)
GFR, Estimated: >60 mL/min (ref >60)
Glucose, Bld: 120 mg/dL — ABNORMAL HIGH (ref 70–99)
Potassium: 3.7 mmol/L (ref 3.5–5.1)
Sodium: 135 mmol/L (ref 135–145)

## 2024-03-24 LAB — TROPONIN I (HIGH SENSITIVITY): Troponin I (High Sensitivity): <2 ng/L (ref <18)

## 2024-03-24 NOTE — ED Notes (Signed)
 Checked lobby and outside for patient. Patient not found

## 2024-03-24 NOTE — ED Triage Notes (Signed)
 Pt to ED for stinging, throbbing non radiating mid chest pain since 1 hour ago. Denies CP, SOB, dizziness. Steady gait. Skin dry.

## 2024-04-21 ENCOUNTER — Other Ambulatory Visit: Payer: Self-pay

## 2024-04-21 ENCOUNTER — Emergency Department
Admission: EM | Admit: 2024-04-21 | Discharge: 2024-04-21 | Discharge status: Against Medical Advice | Attending: Student | Admitting: Student

## 2024-04-21 DIAGNOSIS — F209 Schizophrenia, unspecified: Secondary | ICD-10-CM | POA: Insufficient documentation

## 2024-04-21 DIAGNOSIS — Y900 Blood alcohol level of less than 20 mg/100 ml: Secondary | ICD-10-CM | POA: Insufficient documentation

## 2024-04-21 DIAGNOSIS — Z5321 Procedure and treatment not carried out due to patient leaving prior to being seen by health care provider: Secondary | ICD-10-CM | POA: Diagnosis not present

## 2024-04-21 LAB — ETHANOL: Alcohol, Ethyl (B): <15 mg/dL (ref <15)

## 2024-04-21 LAB — COMPREHENSIVE METABOLIC PANEL WITH GFR
ALT: 30 U/L (ref 0–44)
AST: 33 U/L (ref 15–41)
Albumin: 4 g/dL (ref 3.5–5.0)
Alkaline Phosphatase: 50 U/L (ref 38–126)
Anion gap: 11 (ref 5–15)
BUN: 12 mg/dL (ref 6–20)
CO2: 26 mmol/L (ref 22–32)
Calcium: 9.2 mg/dL (ref 8.9–10.3)
Chloride: 101 mmol/L (ref 98–111)
Creatinine, Ser: 0.9 mg/dL (ref 0.61–1.24)
GFR, Estimated: >60 mL/min (ref >60)
Glucose, Bld: 111 mg/dL — ABNORMAL HIGH (ref 70–99)
Potassium: 3.5 mmol/L (ref 3.5–5.1)
Sodium: 138 mmol/L (ref 135–145)
Total Bilirubin: 0.6 mg/dL (ref 0.0–1.2)
Total Protein: 7.8 g/dL (ref 6.5–8.1)

## 2024-04-21 LAB — CBC
HCT: 42 % (ref 39.0–52.0)
Hemoglobin: 15 g/dL (ref 13.0–17.0)
MCH: 31.1 pg (ref 26.0–34.0)
MCHC: 35.7 g/dL (ref 30.0–36.0)
MCV: 87 fL (ref 80.0–100.0)
Platelets: 258 K/uL (ref 150–400)
RBC: 4.83 MIL/uL (ref 4.22–5.81)
RDW: 12.7 % (ref 11.5–15.5)
WBC: 5.4 K/uL (ref 4.0–10.5)
nRBC: 0 % (ref 0.0–0.2)

## 2024-04-21 NOTE — ED Triage Notes (Addendum)
 Pt sts that he is not feeling himself today. Pt sts that he has schizophrenia and is just not feeling himself. Pt sts that he did not take his medication today. Pt denies any SI/HI

## 2024-04-21 NOTE — ED Notes (Signed)
 Pt called from lobby for reassessment, no answer at this time.  Pt not seen in lobby or outside of ED.

## 2024-04-21 NOTE — ED Provider Triage Note (Signed)
 Emergency Medicine Provider Triage Evaluation Note  Clinton Hart , a 43 y.o. male  was evaluated in triage.  Pt complains of not feeling himself.  Patient states having history of schizophrenia, he is taking medications last time he took his medication was yesterday.  Denies visual or auditory hallucinations.  Review of Systems  Positive:  Negative:  Physical Exam  BP 120/72 (BP Location: Left Arm)   Pulse (!) 110   Temp 98.4 F (36.9 C) (Oral)   Resp 19   Wt 86.2 kg   SpO2 100%   BMI 27.26 kg/m  Gen:   Awake, no distress   Resp:  Normal effort  MSK:   Moves extremities without difficulty  Other:    Medical Decision Making  Medically screening exam initiated at 3:33 PM.  Appropriate orders placed.  Clinton Hart was informed that the remainder of the evaluation will be completed by another provider, this initial triage assessment does not replace that evaluation, and the importance of remaining in the ED until their evaluation is complete.  Patient with complaints of not feeling himself, patient has history of schizophrenia.  Patient denies pain ordered CBC CMP   Janit Kast, PA-C 04/21/24 1534

## 2024-05-06 ENCOUNTER — Other Ambulatory Visit: Payer: Self-pay

## 2024-05-06 ENCOUNTER — Emergency Department
Admission: EM | Admit: 2024-05-06 | Discharge: 2024-05-06 | Discharge status: Against Medical Advice | Attending: Emergency Medicine | Admitting: Emergency Medicine

## 2024-05-06 DIAGNOSIS — F419 Anxiety disorder, unspecified: Secondary | ICD-10-CM | POA: Insufficient documentation

## 2024-05-06 DIAGNOSIS — Z5321 Procedure and treatment not carried out due to patient leaving prior to being seen by health care provider: Secondary | ICD-10-CM | POA: Insufficient documentation

## 2024-05-06 NOTE — ED Triage Notes (Signed)
 Pt to ED for I'm just not feeling well mentally. Hx schizophrenia, denies missing med doses. States feeling anxious. Denies SI and HI. Pt is calm.

## 2024-05-31 ENCOUNTER — Other Ambulatory Visit: Payer: Self-pay

## 2024-05-31 ENCOUNTER — Emergency Department

## 2024-05-31 ENCOUNTER — Emergency Department
Admission: EM | Admit: 2024-05-31 | Discharge: 2024-05-31 | Discharge status: Against Medical Advice | Attending: Emergency Medicine | Admitting: Emergency Medicine

## 2024-05-31 DIAGNOSIS — R509 Fever, unspecified: Secondary | ICD-10-CM | POA: Diagnosis not present

## 2024-05-31 DIAGNOSIS — R059 Cough, unspecified: Secondary | ICD-10-CM | POA: Diagnosis present

## 2024-05-31 DIAGNOSIS — R0981 Nasal congestion: Secondary | ICD-10-CM | POA: Diagnosis not present

## 2024-05-31 DIAGNOSIS — Z5321 Procedure and treatment not carried out due to patient leaving prior to being seen by health care provider: Secondary | ICD-10-CM | POA: Insufficient documentation

## 2024-05-31 LAB — RESP PANEL BY RT-PCR (RSV, FLU A&B, COVID)  RVPGX2
Influenza A by PCR: NEGATIVE
Influenza B by PCR: NEGATIVE
Resp Syncytial Virus by PCR: NEGATIVE
SARS Coronavirus 2 by RT PCR: NEGATIVE

## 2024-05-31 NOTE — ED Triage Notes (Signed)
 Pt to ED via EMS from home, pt repots cough congestion fever for the past 2 days

## 2024-05-31 NOTE — ED Triage Notes (Signed)
 Pt arrives via EMS from home for c/o fever

## 2024-07-11 ENCOUNTER — Encounter: Payer: Self-pay | Admitting: Emergency Medicine

## 2024-07-11 ENCOUNTER — Emergency Department
Admission: EM | Admit: 2024-07-11 | Discharge: 2024-07-11 | Disposition: A | Discharge status: Home / Self Care | Attending: Emergency Medicine | Admitting: Emergency Medicine

## 2024-07-11 ENCOUNTER — Other Ambulatory Visit: Payer: Self-pay

## 2024-07-11 DIAGNOSIS — J45909 Unspecified asthma, uncomplicated: Secondary | ICD-10-CM | POA: Insufficient documentation

## 2024-07-11 DIAGNOSIS — R509 Fever, unspecified: Secondary | ICD-10-CM | POA: Diagnosis present

## 2024-07-11 MED ORDER — ACETAMINOPHEN 500 MG PO TABS
1000.0000 mg | ORAL_TABLET | Freq: Once | ORAL | Status: AC
  Administered 2024-07-11: 1000 mg via ORAL
  Filled 2024-07-11: qty 2

## 2024-07-11 NOTE — Discharge Instructions (Signed)
 You may alternate over the counter Tylenol 1000 mg every 6 hours as needed for pain, fever and Ibuprofen 800 mg every 6-8 hours as needed for pain, fever.  Please take Ibuprofen with food.  Do not take more than 4000 mg of Tylenol (acetaminophen) in a 24 hour period.

## 2024-07-11 NOTE — ED Provider Notes (Signed)
 [facility] Provider Note    Event Date/Time   First MD Initiated Contact with Patient 07/11/24 [phone]     (approximate)   History   Fever   HPI  Clinton Hart is a 43 y.o. male with history of schizophrenia, anxiety, asthma who presents to the emergency department stating that he felt feverish.  Describes it as feeling hot.  No chills.  Did not measure his temperature prior to arrival take any medication.  Denies headache, neck pain or neck stiffness, cough, congestion, sore throat, chest pain, shortness of breath, abdominal pain, vomiting, diarrhea, dysuria or hematuria, rash.   History provided by patient.    Past Medical History:  Diagnosis Date   Anxiety    Asthma    Schizophrenia (HCC)     History reviewed. No pertinent surgical history.  MEDICATIONS:  Prior to Admission medications   Medication Sig Start Date End Date Taking? Authorizing Provider  risperidone  (RISPERDAL ) 4 MG tablet Take 2 tablets (8 mg total) by mouth daily. 09/28/23   Liana Fish, NP    Physical Exam   Triage Vital Signs: ED Triage Vitals [07/11/24 0302]  Encounter Vitals Group     BP 133/89     Girls Systolic BP Percentile      Girls Diastolic BP Percentile      Boys Systolic BP Percentile      Boys Diastolic BP Percentile      Pulse Rate (!) 104     Resp 18     Temp (!) 97.4 F (36.3 C)     Temp Source Oral     SpO2 96 %     Weight      Height      Head Circumference      Peak Flow      Pain Score 0     Pain Loc      Pain Education      Exclude from Growth Chart     Most recent vital signs: Vitals:   07/11/24 0302  BP: 133/89  Pulse: (!) 104  Resp: 18  Temp: (!) 97.4 F (36.3 C)  SpO2: 96%    CONSTITUTIONAL: Alert, responds appropriately to questions. Well-appearing; well-nourished HEAD: Normocephalic, atraumatic EYES: Conjunctivae clear, pupils appear equal, sclera nonicteric ENT: normal nose; moist mucous membranes, no  tonsillar hypertrophy or exudate, no uvular deviation, no trismus or drooling, normal phonation NECK: Supple, normal ROM, no meningismus CARD: RRR; S1 and S2 appreciated RESP: Normal chest excursion without splinting or tachypnea; breath sounds clear and equal bilaterally; no wheezes, no rhonchi, no rales, no hypoxia or respiratory distress, speaking full sentences ABD/GI: Non-distended; soft, non-tender, no rebound, no guarding, no peritoneal signs BACK: The back appears normal EXT: Normal ROM in all joints; no deformity noted, no edema SKIN: Normal color for age and race; warm; no rash on exposed skin NEURO: Moves all extremities equally, normal speech PSYCH: The patient's mood and manner are appropriate.  Calm and cooperative.  Not responding to internal stimuli.   ED Results / Procedures / Treatments   LABS: (all labs ordered are listed, but only abnormal results are displayed) Labs Reviewed - No data to display   EKG:  EKG Interpretation Date/Time:    Ventricular Rate:    PR Interval:    QRS Duration:    QT Interval:    QTC Calculation:   R Axis:      Text Interpretation:  RADIOLOGY: My personal review and interpretation of imaging:    I have personally reviewed all radiology reports.   No results found.   PROCEDURES:  Critical Care performed: No    Procedures    IMPRESSION / MDM / ASSESSMENT AND PLAN / ED COURSE  I reviewed the triage vital signs and the nursing notes.    Patient here with complaints of subjective fevers.  No other acute complaints.  History of schizophrenia with frequent visits to the emergency department often leaving without being seen.       DIFFERENTIAL DIAGNOSIS (includes but not limited to):   Viral illness, malingering, doubt decompensated schizophrenia, low suspicion for meningitis, pneumonia, appendicitis, UTI, bacteremia, sepsis   Patient's presentation is most consistent with acute, uncomplicated  illness.   PLAN: Patient afebrile here.  No other complaints.  Well-appearing here.  Offered COVID, flu and RSV testing which he declines.  Agreeable to Tylenol .  No indication for emergent psychiatric evaluation or further emergent workup.   MEDICATIONS GIVEN IN ED: Medications  acetaminophen  (TYLENOL ) tablet 1,000 mg (has no administration in time range)     ED COURSE:  At this time, I do not feel there is any life-threatening condition present. I reviewed all nursing notes, vitals, pertinent previous records.  All lab and urine results, EKGs, imaging ordered have been independently reviewed and interpreted by myself.  I reviewed all available radiology reports from any imaging ordered this visit.  Based on my assessment, I feel the patient is safe to be discharged home without further emergent workup and can continue workup as an outpatient as needed. Discussed all findings, treatment plan as well as usual and customary return precautions.  They verbalize understanding and are comfortable with this plan.  Outpatient follow-up has been provided as needed.  All questions have been answered.    CONSULTS:  none   OUTSIDE RECORDS REVIEWED: Reviewed previous psychiatric notes.       FINAL CLINICAL IMPRESSION(S) / ED DIAGNOSES   Final diagnoses:  Subjective fever     Rx / DC Orders   ED Discharge Orders     None        Note:  This document was prepared using Dragon voice recognition software and may include unintentional dictation errors.   Arda Keadle, Josette SAILOR, DO 07/11/24 [phone]

## 2024-07-11 NOTE — ED Triage Notes (Signed)
 Pt arrives ambulatory to triage, gait steady, no acute distress noted c/o of feeling feverish that started a few hours ago. Pt denies other complaints or pain anywhere.

## 2024-09-10 ENCOUNTER — Other Ambulatory Visit: Payer: Self-pay

## 2024-09-10 ENCOUNTER — Emergency Department
Admission: EM | Admit: 2024-09-10 | Discharge: 2024-09-10 | Disposition: A | Discharge status: Home / Self Care | Attending: Emergency Medicine | Admitting: Emergency Medicine

## 2024-09-10 DIAGNOSIS — Z5321 Procedure and treatment not carried out due to patient leaving prior to being seen by health care provider: Secondary | ICD-10-CM | POA: Insufficient documentation

## 2024-09-10 DIAGNOSIS — Z0000 Encounter for general adult medical examination without abnormal findings: Secondary | ICD-10-CM | POA: Insufficient documentation

## 2024-09-10 DIAGNOSIS — F209 Schizophrenia, unspecified: Secondary | ICD-10-CM | POA: Insufficient documentation

## 2024-09-10 NOTE — ED Triage Notes (Signed)
 Pt to ED [facility] from home for I wanted to come in and get a look over. No specific complaints. Pt is schizophrenic. Respirations are unlabored.

## 2024-09-29 ENCOUNTER — Emergency Department: Admission: EM | Admit: 2024-09-29 | Discharge: 2024-09-29 | Discharge status: Against Medical Advice

## 2024-09-29 NOTE — ED Triage Notes (Signed)
 First Nurse: Pt to ED via [facility] with c/o generalized body aches. Per EMS pt denies further complaints, A&O x4, pt visualized ambulatory to lobby from EMS bay.   117/62 94HR 96% RA  While this RN receiving report, pt visualized ambulatory out of lobby with steady gait, pt visualized ambulatory up sidewalk, did not inform staff of intent to leave.

## 2024-10-15 ENCOUNTER — Other Ambulatory Visit: Payer: Self-pay

## 2024-10-15 ENCOUNTER — Emergency Department
Admission: EM | Admit: 2024-10-15 | Discharge: 2024-10-15 | Disposition: A | Discharge status: Home / Self Care | Attending: Emergency Medicine | Admitting: Emergency Medicine

## 2024-10-15 DIAGNOSIS — F209 Schizophrenia, unspecified: Secondary | ICD-10-CM | POA: Diagnosis present

## 2024-10-15 DIAGNOSIS — Z5321 Procedure and treatment not carried out due to patient leaving prior to being seen by health care provider: Secondary | ICD-10-CM | POA: Diagnosis not present

## 2024-10-15 NOTE — ED Notes (Signed)
 Pt now stating can I just leave? Pt gathered his belongings and left.

## 2024-10-15 NOTE — ED Triage Notes (Signed)
 Pt to ed from home via [facility] for VC for not feeling myself. Pt denies SI and HI in triage. Pt advised he is schizophrenic and is feeling spiratic. Pt denies hallucinations.

## 2024-11-16 ENCOUNTER — Ambulatory Visit: Payer: 59 | Admitting: Nurse Practitioner
